# Patient Record
Sex: Female | Born: 1975 | Race: Black or African American | Hispanic: No | Smoking: Never smoker
Health system: Southern US, Community
[De-identification: ages and names within clinical notes are randomized; demographics above are authoritative.]

## PROBLEM LIST (undated history)

## (undated) DIAGNOSIS — I10 Essential (primary) hypertension: Secondary | ICD-10-CM

## (undated) HISTORY — PX: KNEE ARTHROSCOPY: SHX127

## (undated) HISTORY — PX: DILATION AND CURETTAGE OF UTERUS: SHX78

---

## 2013-02-22 DIAGNOSIS — R072 Precordial pain: Secondary | ICD-10-CM | POA: Insufficient documentation

## 2013-02-22 DIAGNOSIS — F32A Depression, unspecified: Secondary | ICD-10-CM | POA: Insufficient documentation

## 2013-02-22 DIAGNOSIS — R42 Dizziness and giddiness: Secondary | ICD-10-CM | POA: Insufficient documentation

## 2013-02-22 DIAGNOSIS — K219 Gastro-esophageal reflux disease without esophagitis: Secondary | ICD-10-CM | POA: Insufficient documentation

## 2013-02-22 DIAGNOSIS — F410 Panic disorder [episodic paroxysmal anxiety] without agoraphobia: Secondary | ICD-10-CM | POA: Insufficient documentation

## 2013-02-22 DIAGNOSIS — M542 Cervicalgia: Secondary | ICD-10-CM | POA: Insufficient documentation

## 2013-02-22 DIAGNOSIS — E049 Nontoxic goiter, unspecified: Secondary | ICD-10-CM | POA: Insufficient documentation

## 2013-02-22 DIAGNOSIS — R079 Chest pain, unspecified: Secondary | ICD-10-CM | POA: Insufficient documentation

## 2013-02-22 DIAGNOSIS — F419 Anxiety disorder, unspecified: Secondary | ICD-10-CM | POA: Insufficient documentation

## 2013-02-22 DIAGNOSIS — J209 Acute bronchitis, unspecified: Secondary | ICD-10-CM | POA: Insufficient documentation

## 2013-02-22 DIAGNOSIS — H9190 Unspecified hearing loss, unspecified ear: Secondary | ICD-10-CM | POA: Insufficient documentation

## 2013-02-22 DIAGNOSIS — E559 Vitamin D deficiency, unspecified: Secondary | ICD-10-CM | POA: Insufficient documentation

## 2013-02-22 DIAGNOSIS — R002 Palpitations: Secondary | ICD-10-CM | POA: Insufficient documentation

## 2021-05-31 LAB — OB RESULTS CONSOLE RPR: RPR: NONREACTIVE

## 2021-05-31 LAB — OB RESULTS CONSOLE RUBELLA ANTIBODY, IGM: Rubella: IMMUNE

## 2021-05-31 LAB — OB RESULTS CONSOLE HIV ANTIBODY (ROUTINE TESTING): HIV: NONREACTIVE

## 2021-05-31 LAB — OB RESULTS CONSOLE HEPATITIS B SURFACE ANTIGEN: Hepatitis B Surface Ag: NEGATIVE

## 2021-06-03 ENCOUNTER — Inpatient Hospital Stay (HOSPITAL_COMMUNITY): Admission: AD | Admit: 2021-06-03 | Payer: BC Managed Care – PPO | Source: Home / Self Care | Admitting: Obstetrics

## 2021-08-28 ENCOUNTER — Other Ambulatory Visit: Payer: Self-pay

## 2021-08-29 ENCOUNTER — Inpatient Hospital Stay (HOSPITAL_BASED_OUTPATIENT_CLINIC_OR_DEPARTMENT_OTHER): Payer: BC Managed Care – PPO

## 2021-08-29 ENCOUNTER — Inpatient Hospital Stay (HOSPITAL_COMMUNITY)
Admission: AD | Admit: 2021-08-29 | Discharge: 2021-09-23 | DRG: 787 | Disposition: A | Discharge status: Home / Self Care | Payer: BC Managed Care – PPO | Attending: Obstetrics & Gynecology | Admitting: Obstetrics & Gynecology

## 2021-08-29 ENCOUNTER — Encounter (HOSPITAL_COMMUNITY): Payer: Self-pay | Admitting: Obstetrics and Gynecology

## 2021-08-29 ENCOUNTER — Other Ambulatory Visit: Payer: Self-pay

## 2021-08-29 DIAGNOSIS — O365120 Maternal care for known or suspected placental insufficiency, second trimester, not applicable or unspecified: Secondary | ICD-10-CM | POA: Diagnosis present

## 2021-08-29 DIAGNOSIS — O09522 Supervision of elderly multigravida, second trimester: Secondary | ICD-10-CM

## 2021-08-29 DIAGNOSIS — O10912 Unspecified pre-existing hypertension complicating pregnancy, second trimester: Secondary | ICD-10-CM | POA: Diagnosis not present

## 2021-08-29 DIAGNOSIS — O365931 Maternal care for other known or suspected poor fetal growth, third trimester, fetus 1: Secondary | ICD-10-CM

## 2021-08-29 DIAGNOSIS — O10012 Pre-existing essential hypertension complicating pregnancy, second trimester: Secondary | ICD-10-CM | POA: Diagnosis not present

## 2021-08-29 DIAGNOSIS — Z3A24 24 weeks gestation of pregnancy: Secondary | ICD-10-CM | POA: Diagnosis not present

## 2021-08-29 DIAGNOSIS — Z3A23 23 weeks gestation of pregnancy: Secondary | ICD-10-CM | POA: Diagnosis not present

## 2021-08-29 DIAGNOSIS — O114 Pre-existing hypertension with pre-eclampsia, complicating childbirth: Principal | ICD-10-CM | POA: Diagnosis present

## 2021-08-29 DIAGNOSIS — G4700 Insomnia, unspecified: Secondary | ICD-10-CM | POA: Diagnosis present

## 2021-08-29 DIAGNOSIS — Z20822 Contact with and (suspected) exposure to covid-19: Secondary | ICD-10-CM | POA: Diagnosis present

## 2021-08-29 DIAGNOSIS — O99354 Diseases of the nervous system complicating childbirth: Secondary | ICD-10-CM | POA: Diagnosis present

## 2021-08-29 DIAGNOSIS — O358XX0 Maternal care for other (suspected) fetal abnormality and damage, not applicable or unspecified: Secondary | ICD-10-CM | POA: Diagnosis present

## 2021-08-29 DIAGNOSIS — O365930 Maternal care for other known or suspected poor fetal growth, third trimester, not applicable or unspecified: Secondary | ICD-10-CM | POA: Diagnosis not present

## 2021-08-29 DIAGNOSIS — O9832 Other infections with a predominantly sexual mode of transmission complicating childbirth: Secondary | ICD-10-CM | POA: Diagnosis present

## 2021-08-29 DIAGNOSIS — D563 Thalassemia minor: Secondary | ICD-10-CM | POA: Diagnosis present

## 2021-08-29 DIAGNOSIS — Z23 Encounter for immunization: Secondary | ICD-10-CM

## 2021-08-29 DIAGNOSIS — O321XX0 Maternal care for breech presentation, not applicable or unspecified: Secondary | ICD-10-CM | POA: Diagnosis present

## 2021-08-29 DIAGNOSIS — O365910 Maternal care for other known or suspected poor fetal growth, first trimester, not applicable or unspecified: Secondary | ICD-10-CM | POA: Diagnosis not present

## 2021-08-29 DIAGNOSIS — O99212 Obesity complicating pregnancy, second trimester: Secondary | ICD-10-CM | POA: Diagnosis not present

## 2021-08-29 DIAGNOSIS — O10919 Unspecified pre-existing hypertension complicating pregnancy, unspecified trimester: Secondary | ICD-10-CM

## 2021-08-29 DIAGNOSIS — A6000 Herpesviral infection of urogenital system, unspecified: Secondary | ICD-10-CM | POA: Diagnosis present

## 2021-08-29 DIAGNOSIS — O1492 Unspecified pre-eclampsia, second trimester: Secondary | ICD-10-CM | POA: Diagnosis not present

## 2021-08-29 DIAGNOSIS — O3412 Maternal care for benign tumor of corpus uteri, second trimester: Secondary | ICD-10-CM | POA: Diagnosis present

## 2021-08-29 DIAGNOSIS — O99214 Obesity complicating childbirth: Secondary | ICD-10-CM | POA: Diagnosis present

## 2021-08-29 DIAGNOSIS — O365990 Maternal care for other known or suspected poor fetal growth, unspecified trimester, not applicable or unspecified: Secondary | ICD-10-CM

## 2021-08-29 DIAGNOSIS — E669 Obesity, unspecified: Secondary | ICD-10-CM

## 2021-08-29 DIAGNOSIS — O365920 Maternal care for other known or suspected poor fetal growth, second trimester, not applicable or unspecified: Secondary | ICD-10-CM

## 2021-08-29 DIAGNOSIS — O1002 Pre-existing essential hypertension complicating childbirth: Secondary | ICD-10-CM | POA: Diagnosis present

## 2021-08-29 DIAGNOSIS — O1012 Pre-existing hypertensive heart disease complicating childbirth: Secondary | ICD-10-CM | POA: Diagnosis not present

## 2021-08-29 DIAGNOSIS — Z3A25 25 weeks gestation of pregnancy: Secondary | ICD-10-CM | POA: Diagnosis not present

## 2021-08-29 DIAGNOSIS — O1410 Severe pre-eclampsia, unspecified trimester: Secondary | ICD-10-CM | POA: Diagnosis not present

## 2021-08-29 DIAGNOSIS — I10 Essential (primary) hypertension: Secondary | ICD-10-CM | POA: Diagnosis present

## 2021-08-29 DIAGNOSIS — O365925 Maternal care for other known or suspected poor fetal growth, second trimester, fetus 5: Secondary | ICD-10-CM

## 2021-08-29 DIAGNOSIS — D259 Leiomyoma of uterus, unspecified: Secondary | ICD-10-CM | POA: Diagnosis present

## 2021-08-29 DIAGNOSIS — O1415 Severe pre-eclampsia, complicating the puerperium: Secondary | ICD-10-CM | POA: Diagnosis present

## 2021-08-29 DIAGNOSIS — Z98891 History of uterine scar from previous surgery: Secondary | ICD-10-CM

## 2021-08-29 DIAGNOSIS — Z3689 Encounter for other specified antenatal screening: Secondary | ICD-10-CM

## 2021-08-29 DIAGNOSIS — E668 Other obesity: Secondary | ICD-10-CM | POA: Diagnosis not present

## 2021-08-29 DIAGNOSIS — Z3A26 26 weeks gestation of pregnancy: Secondary | ICD-10-CM | POA: Diagnosis not present

## 2021-08-29 DIAGNOSIS — O09812 Supervision of pregnancy resulting from assisted reproductive technology, second trimester: Secondary | ICD-10-CM | POA: Diagnosis not present

## 2021-08-29 HISTORY — DX: Essential (primary) hypertension: I10

## 2021-08-29 LAB — COMPREHENSIVE METABOLIC PANEL
ALT: 36 U/L (ref 0–44)
AST: 35 U/L (ref 15–41)
Albumin: 3.1 g/dL — ABNORMAL LOW (ref 3.5–5.0)
Alkaline Phosphatase: 53 U/L (ref 38–126)
Anion gap: 9 (ref 5–15)
BUN: 8 mg/dL (ref 6–20)
CO2: 23 mmol/L (ref 22–32)
Calcium: 9.4 mg/dL (ref 8.9–10.3)
Chloride: 105 mmol/L (ref 98–111)
Creatinine, Ser: 0.82 mg/dL (ref 0.44–1.00)
GFR, Estimated: >60 mL/min (ref >60)
Glucose, Bld: 95 mg/dL (ref 70–99)
Potassium: 3.9 mmol/L (ref 3.5–5.1)
Sodium: 137 mmol/L (ref 135–145)
Total Bilirubin: 0.2 mg/dL — ABNORMAL LOW (ref 0.3–1.2)
Total Protein: 5.9 g/dL — ABNORMAL LOW (ref 6.5–8.1)

## 2021-08-29 LAB — RESP PANEL BY RT-PCR (FLU A&B, COVID) ARPGX2
Influenza A by PCR: NEGATIVE
Influenza B by PCR: NEGATIVE
SARS Coronavirus 2 by RT PCR: NEGATIVE

## 2021-08-29 LAB — CBC
HCT: 37.7 % (ref 36.0–46.0)
Hemoglobin: 12 g/dL (ref 12.0–15.0)
MCH: 27.4 pg (ref 26.0–34.0)
MCHC: 31.8 g/dL (ref 30.0–36.0)
MCV: 86.1 fL (ref 80.0–100.0)
Platelets: 203 10*3/uL (ref 150–400)
RBC: 4.38 MIL/uL (ref 3.87–5.11)
RDW: 14.3 % (ref 11.5–15.5)
WBC: 7.4 10*3/uL (ref 4.0–10.5)
nRBC: 0 % (ref 0.0–0.2)

## 2021-08-29 LAB — TYPE AND SCREEN
ABO/RH(D): O POS
Antibody Screen: NEGATIVE

## 2021-08-29 LAB — PROTEIN / CREATININE RATIO, URINE
Creatinine, Urine: 259.49 mg/dL
Protein Creatinine Ratio: 0.13 mg/mg{Cre} (ref 0.00–0.15)
Total Protein, Urine: 33 mg/dL

## 2021-08-29 MED ORDER — LABETALOL HCL 200 MG PO TABS: 400.0000 mg | ORAL_TABLET | Freq: Three times a day (TID) | ORAL | Status: DC

## 2021-08-29 MED ORDER — ACYCLOVIR 400 MG PO TABS
400.0000 mg | ORAL_TABLET | Freq: Three times a day (TID) | ORAL | Status: DC
  Administered 2021-08-29 – 2021-09-02 (×9): 400 mg via ORAL
  Filled 2021-08-29 (×14): qty 1

## 2021-08-29 MED ORDER — ASPIRIN 81 MG PO CHEW
81.0000 mg | CHEWABLE_TABLET | Freq: Every day | ORAL | Status: DC
  Administered 2021-08-29 – 2021-09-23 (×26): 81 mg via ORAL
  Filled 2021-08-29 (×26): qty 1

## 2021-08-29 MED ORDER — NIFEDIPINE ER OSMOTIC RELEASE 30 MG PO TB24
30.0000 mg | ORAL_TABLET | Freq: Every day | ORAL | Status: DC
  Administered 2021-08-30 – 2021-09-16 (×18): 30 mg via ORAL
  Filled 2021-08-29 (×19): qty 1

## 2021-08-29 MED ORDER — BETAMETHASONE SOD PHOS & ACET 6 (3-3) MG/ML IJ SUSP
12.0000 mg | INTRAMUSCULAR | Status: AC
  Administered 2021-08-29 – 2021-08-30 (×2): 12 mg via INTRAMUSCULAR
  Filled 2021-08-29: qty 5

## 2021-08-29 MED ORDER — LABETALOL HCL 200 MG PO TABS
400.0000 mg | ORAL_TABLET | Freq: Three times a day (TID) | ORAL | Status: DC
  Administered 2021-08-29 – 2021-08-30 (×3): 400 mg via ORAL
  Filled 2021-08-29 (×3): qty 2

## 2021-08-29 MED ORDER — ACETAMINOPHEN 325 MG PO TABS
650.0000 mg | ORAL_TABLET | ORAL | Status: DC | PRN
  Filled 2021-08-29 (×2): qty 2

## 2021-08-29 MED ORDER — HYDROXYZINE HCL 50 MG PO TABS
25.0000 mg | ORAL_TABLET | Freq: Every day | ORAL | Status: DC
  Administered 2021-08-29 – 2021-09-22 (×25): 25 mg via ORAL
  Filled 2021-08-29 (×25): qty 1

## 2021-08-29 MED ORDER — CALCIUM CARBONATE ANTACID 500 MG PO CHEW: 2.0000 | CHEWABLE_TABLET | ORAL | Status: DC | PRN

## 2021-08-29 MED ORDER — NIFEDIPINE ER OSMOTIC RELEASE 30 MG PO TB24: 30.0000 mg | ORAL_TABLET | Freq: Two times a day (BID) | ORAL | Status: DC

## 2021-08-29 MED ORDER — NIFEDIPINE ER OSMOTIC RELEASE 30 MG PO TB24: 30.0000 mg | ORAL_TABLET | Freq: Every day | ORAL | Status: DC

## 2021-08-29 MED ORDER — ZOLPIDEM TARTRATE 5 MG PO TABS: 5.0000 mg | ORAL_TABLET | Freq: Every evening | ORAL | Status: DC | PRN

## 2021-08-29 MED ORDER — PRENATAL MULTIVITAMIN CH
1.0000 | ORAL_TABLET | Freq: Every day | ORAL | Status: DC
  Administered 2021-08-29 – 2021-09-19 (×22): 1 via ORAL
  Filled 2021-08-29 (×22): qty 1

## 2021-08-29 MED ORDER — DOCUSATE SODIUM 100 MG PO CAPS
100.0000 mg | ORAL_CAPSULE | Freq: Every day | ORAL | Status: DC
  Administered 2021-08-30 – 2021-09-19 (×11): 100 mg via ORAL
  Filled 2021-08-29 (×20): qty 1

## 2021-08-29 MED ORDER — NIFEDIPINE ER OSMOTIC RELEASE 60 MG PO TB24
60.0000 mg | ORAL_TABLET | Freq: Every day | ORAL | Status: DC
  Administered 2021-08-29 – 2021-09-15 (×18): 60 mg via ORAL
  Filled 2021-08-29 (×18): qty 1

## 2021-08-29 MED ORDER — LACTATED RINGERS IV BOLUS
1000.0000 mL | Freq: Once | INTRAVENOUS | Status: AC
  Administered 2021-08-29: 1000 mL via INTRAVENOUS

## 2021-08-29 NOTE — MAU Note (Signed)
Presents for BP evaluation, states sent from office.  Endorses current H/A, denies visual disturbances and epigastric pain.  Denies VB or LOF.  Endorses +FM.

## 2021-08-29 NOTE — Progress Notes (Signed)
Spoke w/ MFM Dr Gertie Exon and reviewed consult note, appreciate assistance w/ patient care  Plan to increase Procardia to 60mg  XL tonight and reassess if 60mg  in AM needed or continue 30mg  XL in AM Repeat Labs 1/14  CEFM 2 hours every 6 hours as lnong as no decels and needs to be CEFM if HTN crisis 160/110 or worse

## 2021-08-29 NOTE — H&P (Signed)
Brianna Lucero is a 46 y.o. female presenting for BP control and fetal monitoring due to recent worsening BPs with some in severe range and new onset severe IUGR at 23.5 wks and elevated UAD. Pt was seen in the office on 08/28/21 and was advised to come for admission for observation, titrate BP meds and MFM consult and repeat UAD   46 yo G3P0020. AMA, CHTN, Fibroids, HSV II, Obesity, IVF preg -donor egg (PGS not tested). 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 Pt received flu vaccine at 1st ob visit.   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  Obesity and famHx of DM II- A1C 6.0 in 1st trim.  VTE and swelling -. Compression stockings Insomnia- Hydroxyzine prn   Anatomy sono EFW  overall 49%, AC 44% BPD 48% FL 49% female, Vx, placenta anterior  Sono on 08/28/21 23.5 wks - 1'1" at 2% AC 3% BPD 1% FL 9% Breech, 22.2 wks by measurements, UAD elevated 6.52, AFI  pocket 4.9 cm, Breech.  BPP 8/8.Marland Kitchen Funic presentation. Right renal pyelectasis 4 mm.       24 hr urine protein 135 mg on 06/28/21 Urine P/C 0.15 on 08/13/21 CMP wnl on 08/13/21  OB History     Gravida  1   Para      Term      Preterm      AB      Living  0      SAB      IAB      Ectopic      Multiple      Live Births  0          Past Medical History:  Diagnosis Date   Hypertension    Past Surgical History:  Procedure Laterality Date    DILATION AND CURETTAGE OF UTERUS     KNEE ARTHROSCOPY     Family History: family history is not on file. Social History:  reports that she has never smoked. She has never used smokeless tobacco. She reports that she does not drink alcohol and does not use drugs.     Maternal Diabetes: No  A1C 6.0 in 1st trim  Genetic Screening: Normal  NIPT and AFP1  Maternal Ultrasounds/Referrals: IUGR and Other: anatomy sono at 18.5 wks with normal growth at 43 % and now at 23.5 wks dropped to 2% Fetal Ultrasounds or other Referrals:  Referred to [facility] F[facility]  saw MFM Dr Wynelle Link before pregnancy  Maternal Substance Abuse:  No Significant Maternal Medications:  Meds include: Other: Procardia 30 mg XL bid, Labetalol 400mg  TID, Hydroxyzine prn Significant Maternal Lab Results:  Other: carrier screen +  + Alpha thal minor (FOB also, 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 rate 20, height 5\' 6"  (1.676 m), weight 106.5 kg, SpO2 100 %. Exam Physical Exam  Patient Vitals for the past 24 hrs:  BP Temp Temp src Pulse Resp SpO2 Height Weight  08/29/21 1528 (!) 174/95 98.7 F (37.1 C) Oral 80 19 -- -- --  08/29/21 1130 (!) 155/86 -- -- 70 -- -- -- --  08/29/21 1115 (!) 148/87 -- -- 77 -- 100 % -- --  08/29/21 1100 (!) 142/82 -- -- 76 -- 100 % -- --  08/29/21 1054 137/83 -- -- 70 -- -- -- --  08/29/21 1038 138/86 -- -- 72 20 100 % -- --  08/29/21 1002 (!) 149/89 98.1 F (36.7 C) Oral 77 20 99 % -- --  08/29/21 0956 -- -- -- -- -- -- 5\' 6"  (1.676 m) 106.5 kg    Physical exam:  A&O x 3, no acute distress. Pleasant HEENT neg, no thyromegaly Lungs CTA bilat CV RRR, S1S2 normal Abdo soft, non tender, non acute gravid, BREECH  Extr no  edema/ tenderness. Slight edema, DTR +2 Pelvic deferred FHT  140s minimal variability + accels, no decels overall reactive Toco irritability noted   Prenatal labs: ABO, Rh: --/--/O POS (01/12 1238) Antibody: NEG (01/12 1238) Rubella:  Immu VZ immune RPR:   neg HBsAg:   neg HIV:   neg GBS:   N/A    Assessment/Plan: 46 yo G3P0020, here for worsening chronic hypertension, new onset IUGR at 23.5 wks.  Admit for observation IUGR - CEFM to assess fetal status and repeat UAD and MFM consult for discharge planning. BTMZ for prematurity due to abn UAD CHTN- PIH Labs  wnl. BPs non severe range, Labetalol 400 mg q8 hrs, Procardia 30mg  XL q 12 hrs , bbASA. BP better, likely all increased doses now working and also stopped working since yesterday, contin modified bedrest Insomnia- Hydroxyzine at night   Obesity - SCDs, will assess to start Lovenox if long term stay Fibroids- assess positions for delivery planning Breech - C/s likely unless turns  HSV- no outbreaks, can start suppression  + Carrier states- but donor (egg) is clear of all carrier states  MFM consult pending    Elveria Royals 08/29/2021, 2:50 PM

## 2021-08-29 NOTE — Consult Note (Addendum)
MFM Consultation (UPDATED WITH [place]).  Brianna Lucero is a 46 yo AA at 31 w 6d with an EDD of 12/20/21 with good dates. She is seen today at the request of Dr. Azucena Fallen regarding [facility] and severe FGR.  Brianna Lucero is overall doing well she denies s/sx of PTL or preeclamspsia. She had a headached the quickly resolved with improved blood pressure.  She has a history of CHTN which has been difficult to control over the last 3 weeks. She was initially started on Labetalol 100 TID and has now titrated to 400 TID with Procardia 30 mg XL BID.  Her CBC, CMP and UPC are normal.  In addition, yesterday she was diagnosis with FGR with abnormal UA Dopplers (uncertain quality elevated vs absent vs reversed??) and an EFW 2%.  She received her first [place] today.  Vitals:   08/29/21 1130 08/29/21 1528 08/29/21 1545 08/29/21 1620  BP: (!) 155/86 (!) 174/95 (!) 170/93 (!) 159/89  Pulse: 70 80 83   Temp:  98.7 F (37.1 C)    Resp:  19    Height:      Weight:      SpO2:      TempSrc:  Oral    BMI (Calculated):       CBC Latest Ref Rng & Units 08/29/2021  WBC 4.0 - 10.5 K/uL 7.4  Hemoglobin 12.0 - 15.0 g/dL 12.0  Hematocrit 36.0 - 46.0 % 37.7  Platelets 150 - 400 K/uL 203   CMP Latest Ref Rng & Units 08/29/2021  Glucose 70 - 99 mg/dL 95  BUN 6 - 20 mg/dL 8  Creatinine 0.44 - 1.00 mg/dL 0.82  Sodium 135 - 145 mmol/L 137  Potassium 3.5 - 5.1 mmol/L 3.9  Chloride 98 - 111 mmol/L 105  CO2 22 - 32 mmol/L 23  Calcium 8.9 - 10.3 mg/dL 9.4  Total Protein 6.5 - 8.1 g/dL 5.9(L)  Total Bilirubin 0.3 - 1.2 mg/dL 0.2(L)  Alkaline Phos 38 - 126 U/L 53  AST 15 - 41 U/L 35  ALT 0 - 44 U/L 36   OB History  Gravida Para Term Preterm AB Living  1 0 0 0 0 0  SAB IAB Ectopic Multiple Live Births  0 0 0 0 0    # Outcome Date GA Lbr Len/2nd Weight Sex Delivery Anes PTL Lv  1 Current            Past Medical History:  Diagnosis Date   Hypertension    Past Surgical History:  Procedure Laterality Date   DILATION  AND CURETTAGE OF UTERUS     KNEE ARTHROSCOPY     Social History   Socioeconomic History   Marital status: Divorced    Spouse name: Not on file   Number of children: Not on file   Years of education: Not on file   Highest education level: Not on file  Occupational History   Not on file  Tobacco Use   Smoking status: Never   Smokeless tobacco: Never  Substance and Sexual Activity   Alcohol use: Never   Drug use: Never   Sexual activity: Yes  Other Topics Concern   Not on file  Social History Narrative   Not on file   Social Determinants of Health   Financial Resource Strain: Not on file  Food Insecurity: Not on file  Transportation Needs: Not on file  Physical Activity: Not on file  Stress: Not on file  Social Connections: Not on file  Intimate Partner Violence: Not on file   History reviewed. No pertinent family history.  Current Facility-Administered Medications (Endocrine & Metabolic):    betamethasone acetate-betamethasone sodium phosphate (CELESTONE) injection 12 mg   Current Facility-Administered Medications (Cardiovascular):    labetalol (NORMODYNE) tablet 400 mg   NIFEdipine (PROCARDIA-XL/NIFEDICAL-XL) 24 hr tablet 30 mg     Current Facility-Administered Medications (Analgesics):    acetaminophen (TYLENOL) tablet 650 mg   aspirin chewable tablet 81 mg     Current Facility-Administered Medications (Other):    acyclovir (ZOVIRAX) tablet 400 mg   calcium carbonate (TUMS - dosed in mg elemental calcium) chewable tablet 400 mg of elemental calcium   docusate sodium (COLACE) capsule 100 mg   hydrOXYzine (ATARAX) tablet 25 mg   prenatal multivitamin tablet 1 tablet   zolpidem (AMBIEN) tablet 5 mg  No current outpatient medications on file. History reviewed. No pertinent family history. No Known Allergies   Imaging: Pending bedside exam  Impression/Counseling:  I reviewed the above history with Brianna Lucero. I discussed the diagnosis, evaluation  and management of severe hypertension and severe fetal growth restriction.  I discussed the diagnosis of severe hypertension includes blood pressure >160/110 mmHg. We recommend maintaining blood pressure below 150/100 mmHg with a goal of 135/85 mmHg. Management includes antihypertension agents including oral and IV. In cases, like Ms. Scardina's we recommend cycling IV therapy if blood pressure persist over a 15 minute period above the previously stated threshold. We adhere to the ACOG protocol employing Labetalol, oral IR procardia and hydralazine. If blood pressure is not controlled we recommend delivery.  It is reasonable given this early gestational age to maximize two agents.  I discussed with Brianna Lucero the risk of severe hypertension includes maternal stroke, placental abruption, stillbirth, eclampsia and fetal growth restriction.  Regarding fetal growth restriction. I discussed the diagnosis, evaluation and management of fetal growth restriction.  A growth ultrasound is ordered today to rule out other causes, but I suspect this is placental insufficiency. In addition UA Dopplers will be performed. IF abnormal we recommend 2x weekly UA Dopplers. Daily NST are recommend with the goal of detecting good variability and the absence of fetal decelerations. Given this early gestational age we appreciate the difficulty of a consistent tracing that occurs with more advanced gestational ages.   A NICU consult has been requested and we are aware that the NICU is closed and will require transfer to [facility] is delivery is likely in the next few days.  Lastly indications for delivery include: Abnormal labs Non-reassuring fetal testing Persistent CNS maternal symptoms. Uncontrolled BP.  Recommendations: -Repeat labs every 48-72  hours while symptoms persist -Maintain BP 135/85 avg -Repeat growth in 3 weeks -Daily NST and 2x weekly UA Dopplers -Complete course of BMZ. -Transfer to [facility]  if blood pressure are not controlled within 48 hours or imminent delivery is suspected.  Consider discharge to outpatient if blood pressure are stable for 36-48 hours.  All questions answered   I spent 60 minutes with > 50% in face to face consultation.  I discussed this plan of care with Dr. Benjie Karvonen.  Vikki Ports, MD  ADDENDUM US PERFORMED ON 1/12 Single intrauterine pregnancy here for a detailed anatomy due to new diagnosis of fetal growth restriction with severe hypertension. Normal anatomy with measurements consistent with fetal growth restriction of 4.5% There is good fetal movement and amniotic fluid volume Suboptimal views of the fetal anatomy were obtained secondary to fetal position.  The UA Dopplers  demonstrate normal flow without evidence of AEDF or REDF.  The outside exam suggested elevated UA Dopplers therefore weekly UA Dopplers can be performed instead of twice weekly UA Dopplers.

## 2021-08-29 NOTE — Progress Notes (Signed)
D/w pt NICU beds not open, lives near [place] and we can transfer to MFM/ Antenatal service there if this becomes a long term admission

## 2021-08-29 NOTE — MAU Note (Signed)
.  Brianna Lucero is a 46 y.o. at [redacted]w[redacted]d here in MAU reporting: Direct admit for HTN management. Patient has a history of CHTN, IUGR, IVF, A1c 6.  Awaiting POC from Dr. Benjie Karvonen.   Vitals:   08/29/21 1002 08/29/21 1038  BP: (!) 149/89 138/86  Pulse: 77 72  Resp: 20 20  Temp: 98.1 F (36.7 C)   SpO2: 99% 100%     FHT 140  Lab orders placed from triage:  UA

## 2021-08-29 NOTE — Progress Notes (Signed)
Dr. Benjie Karvonen made aware of her severe range BP x 2, per MD, repeat BP in 30 minutes and if still in severe range, call her back to increase po BP medication orders.

## 2021-08-29 NOTE — MAU Provider Note (Signed)
History     [id number]  Arrival date and time: 08/29/21 0934    No chief complaint on file.    HPI Brianna Lucero is a 46 y.o. at [redacted]w[redacted]d by IVF insemination with PMHx notable for cHTN on meds, who presents for elevated BP.   Review of outside prenatal records from [facility] (in media tab): hx of recurrent losses, AMA, IVF pregnancy, cHTN, HSV, fibroids. Current meds ASA, acyclovir, labetalol, procardia. Review of BP's show they have been mostly mild range in 130-140s/80-90's except for visit yesterday when she had a BP of 160/88. Then had growth scan which showed IUGR 2% and abnormal dopplers. Admission recommended, plan to come to MAU.  Patient feeling well at present Endorses a headache earlier today that is going away No vision changes, chest pain Has had SOB for months, but no recent change No RUQ pain Endorses significant swelling of her lower extremities No vaginal bleeding or loss of fluid Endorses fetal movement No contractions     OB History     Gravida  1   Para      Term      Preterm      AB      Living  0      SAB      IAB      Ectopic      Multiple      Live Births  0           Past Medical History:  Diagnosis Date   Hypertension     Past Surgical History:  Procedure Laterality Date   DILATION AND CURETTAGE OF UTERUS     KNEE ARTHROSCOPY      History reviewed. No pertinent family history.  Social History   Socioeconomic History   Marital status: Divorced    Spouse name: Not on file   Number of children: Not on file   Years of education: Not on file   Highest education level: Not on file  Occupational History   Not on file  Tobacco Use   Smoking status: Never   Smokeless tobacco: Never  Substance and Sexual Activity   Alcohol use: Never   Drug use: Never   Sexual activity: Yes  Other Topics Concern   Not on file  Social History Narrative   Not on file   Social Determinants of Health   Financial  Resource Strain: Not on file  Food Insecurity: Not on file  Transportation Needs: Not on file  Physical Activity: Not on file  Stress: Not on file  Social Connections: Not on file  Intimate Partner Violence: Not on file    Not on File  No current facility-administered medications on file prior to encounter.   Current Outpatient Medications on File Prior to Encounter  Medication Sig Dispense Refill   cholecalciferol (VITAMIN D3) 25 MCG (1000 UNIT) tablet Take 1,000 Units by mouth daily.     labetalol (NORMODYNE) 200 MG tablet Take 400 mg by mouth 3 (three) times daily.     loratadine (CLARITIN) 10 MG tablet Take 10 mg by mouth daily.     NIFEdipine (PROCARDIA-XL/NIFEDICAL-XL) 30 MG 24 hr tablet Take 30 mg by mouth 2 (two) times daily.     Prenatal Vit-Fe Fumarate-FA (PRENATAL MULTIVITAMIN) TABS tablet Take 1 tablet by mouth daily at 12 noon.       ROS Pertinent positives and negative per HPI, all others reviewed and negative  Physical Exam   BP (!) 142/82  Pulse 76    Temp 98.1 F (36.7 C) (Oral)    Resp 20    Ht 5\' 6"  (1.676 m)    Wt 106.5 kg    SpO2 100%    BMI 37.90 kg/m   Patient Vitals for the past 24 hrs:  BP Temp Temp src Pulse Resp SpO2 Height Weight  08/29/21 1100 (!) 142/82 -- -- 76 -- 100 % -- --  08/29/21 1054 137/83 -- -- 70 -- -- -- --  08/29/21 1038 138/86 -- -- 72 20 100 % -- --  08/29/21 1002 (!) 149/89 98.1 F (36.7 C) Oral 77 20 99 % -- --  08/29/21 0956 -- -- -- -- -- -- 5\' 6"  (1.676 m) 106.5 kg    Physical Exam Vitals reviewed.  Constitutional:      General: She is not in acute distress.    Appearance: She is well-developed. She is not diaphoretic.  Eyes:     General: No scleral icterus. Pulmonary:     Effort: Pulmonary effort is normal. No respiratory distress.  Skin:    General: Skin is warm and dry.  Neurological:     Mental Status: She is alert.     Coordination: Coordination normal.     Cervical Exam    Bedside Ultrasound Not  done  My interpretation: n/a  FHT Unable to trace baby  Labs Results for orders placed or performed during the hospital encounter of 08/29/21 (from the past 24 hour(s))  CBC     Status: None   Collection Time: 08/29/21 10:24 AM  Result Value Ref Range   WBC 7.4 4.0 - 10.5 K/uL   RBC 4.38 3.87 - 5.11 MIL/uL   Hemoglobin 12.0 12.0 - 15.0 g/dL   HCT 37.7 36.0 - 46.0 %   MCV 86.1 80.0 - 100.0 fL   MCH 27.4 26.0 - 34.0 pg   MCHC 31.8 30.0 - 36.0 g/dL   RDW 14.3 11.5 - 15.5 %   Platelets 203 150 - 400 K/uL   nRBC 0.0 0.0 - 0.2 %    Imaging No results found.  MAU Course  Procedures Lab Orders         CBC         Comprehensive metabolic panel         Protein / creatinine ratio, urine    No orders of the defined types were placed in this encounter.  Imaging Orders  No imaging studies ordered today    MDM High  Assessment and Plan  #Likely cHTN with superimposed pre-eclampsia with severe features #IUGR fetus Patient records reviewed extensively. Overall picture very concerning for superimposed Pre-E given escalating BP med requirements, IUGR, and abnormal umbilical doppler arteries. Per RN discussion with Dr. Benjie Karvonen she will be admitted to [facility], admission approved by NICU (currently on red status). Basic Pre-E labs pending.     Dispo: admit to [facility] per Dr. Benjie Karvonen who will assume care.    Clarnce Flock, MD/MPH 08/29/21 11:10 AM

## 2021-08-30 MED ORDER — LABETALOL HCL 200 MG PO TABS
600.0000 mg | ORAL_TABLET | Freq: Three times a day (TID) | ORAL | Status: DC
  Administered 2021-08-30 – 2021-09-21 (×66): 600 mg via ORAL
  Filled 2021-08-30 (×66): qty 3

## 2021-08-30 NOTE — Progress Notes (Signed)
Patient ID: Chasiti Waddington, female   DOB: 1975/09/26, 46 y.o.   MRN: [id number] HD #2 24 wks  Severe IUGR CHTN with worsening BPs   S: Feels well, no HA/ SOB/ CP. Got some rest off monitors.   O: BP 137/81 (BP Location: Left Arm)    Pulse 81    Temp 99.3 F (37.4 C) (Oral)    Resp 18    Ht 5\' 6"  (1.676 m)    Wt 106.7 kg    SpO2 97%    BMI 37.97 kg/m   Temp:  [97.9 F (36.6 C)-99.3 F (37.4 C)] 99.3 F (37.4 C) (01/13 1602) Pulse Rate:  [80-90] 81 (01/13 1602) Resp:  [18-20] 18 (01/13 1602) BP: (137-164)/(69-88) 137/81 (01/13 1602) SpO2:  [93 %-100 %] 97 % (01/13 1602) Weight:  [106.7 kg] 106.7 kg (01/13 0801)   NAD Lungs CTA CV RRR Gravid soft fibroid uterus  No LE swelling. HOman's neg   FHT 140s mid variability + accels no decels cat I / reactive for 24 wks Toco none  PIH labs reviewed MFM sono 1/12 reviewed  Growth 4.5 %  AC 5% AFI nl, UAD- normal (not elevated here) breech  A/P: 46 yo G3P0020 at 24 wks. Here for BP control and monitor severe IUGR  1) CHTN- increase meds- now Procardia 30mg  XL in AM, 60 mg XL in PM and increased Labetalol to 600 mg TID after consult w/ Dr Gertie Exon this morning  2) IUGR- UAD improved with BP improved 3) Prematurity - S/p BMTZ 1/12, 1/13  4) Breech  5) AMA, IVF (donor egg)  Plan per MFM- Discharge if BPs range <135-140/ 85 and  labs nl. Outpt management with three times/ wk testing and wly UAD and q 3 wk growth    --Azucena Fallen MD

## 2021-08-31 NOTE — Progress Notes (Signed)
Patient ID: Brianna Lucero, female   DOB: 1975/10/28, 46 y.o.   MRN: [id number] HD #3 24'1 wks  Severe IUGR CHTN with worsening BPs though some improvement with increase labetalol and procardia, now on labetalol 600mg  tid and procardia XL 30 qam and 60 qpm.   S: Feels well, no HA/ SOB/ CP. Hopeful for discharge.   O: BP 136/77 (BP Location: Left Arm)    Pulse 88    Temp 99.2 F (37.3 C) (Oral)    Resp 18    Ht 5\' 6"  (1.676 m)    Wt 106.9 kg    SpO2 98%    BMI 38.03 kg/m   Vitals:   08/31/21 0619 08/31/21 0620 08/31/21 0847 08/31/21 1125  BP: (!) 144/80 (!) 144/80 (!) 148/83 136/77  Pulse: 87 87 86 88  Resp:  18 18 18   Temp:   98.7 F (37.1 C) 99.2 F (37.3 C)  TempSrc:   Oral Oral  SpO2:  98% 98% 98%  Weight:      Height:         Temp:  [98.6 F (37 C)-99.3 F (37.4 C)] 99.2 F (37.3 C) (01/14 1125) Pulse Rate:  [81-90] 88 (01/14 1125) Resp:  [18] 18 (01/14 1125) BP: (136-164)/(77-88) 136/77 (01/14 1125) SpO2:  [97 %-98 %] 98 % (01/14 1125) Weight:  [106.9 kg] 106.9 kg (01/14 0410)   NAD Lungs CTA CV RRR Gravid soft fibroid uterus  No RUQ pain No LE swelling.   FHT 140s mid variability + accels no decels cat I / reactive for 24 wks Toco none  PIH labs reviewed MFM sono 1/12 reviewed  Growth 4.5 %  AC 5% AFI nl, UAD- normal (not elevated here) breech  NST 08/30/21 9p: b/l 135s, + 15x15 accles, 10 beat variability, occasional sharp variable decels. Toco: no contractions. AGA  NST 08/31/21, 9a: b/a 140s, + 10x10 accelerations, 10 beat variability,occasional sharp variable decels. Toco: no contractions. AGA  A/P: 46 yo G3P0020 at 24'1 wks. Here for BP control and monitor severe IUGR  1) CHTN- improved control, still elevated bps over past 24 hrs but no maternal sx and no severe range bps. Will cont current medication regimen, caution with dropping placental perfusion pressure in a growth restricted fetus 2) IUGR- UAD improved. Repeat on Monday to reassess for  possible d/c.  3) Prematurity - S/p BMTZ 1/12, 1/13  4) Breech  5) AMA, IVF (donor egg) Dispo- in house for now given occ variable decels and elevated bp, cont inpt monitoring until repeat dopplers on Mon.   Plan per MFM- Discharge if BPs range <135-140/ 85 and  labs nl. Outpt management with three times/ wk testing and wly UAD and q 3 wk growth    Brianna Lucero 08/31/2021 2:04 PM

## 2021-09-01 NOTE — Progress Notes (Addendum)
Patient ID: Brianna Lucero, female   DOB: [birth year], 46 y.o.   MRN: [id number] HD #4 24'2 wks  Severe IUGR CHTN with worsening BPs though  improvement with increase labetalol and procardia, now on labetalol 600mg  tid and procardia XL 30 qam and 60 qpm.   S: Feels well, no HA/ SOB/ CP.  No vision change.  Good fetal movement.  No vaginal bleeding, no leakage of fluid, no contractions.  Patient notes intermittent anxiety and notes that she worries quite a lot.  Patient gives history of anxiety and was on Zoloft until about 3 years ago.  Patient notes currently having more worry being in the hospital and given medical complications.  O: BP (!) 150/83 (BP Location: Left Arm)    Pulse 74    Temp 98.6 F (37 C) (Oral)    Resp 18    Ht 5\' 6"  (1.676 m)    Wt 109.3 kg    SpO2 98%    BMI 38.90 kg/m   Vitals:   08/31/21 2031 08/31/21 2306 09/01/21 0423 09/01/21 0809  BP: (!) 156/80 131/64 (!) 145/64 (!) 150/83  Pulse: 74 88 87 74  Resp:  18 18 18   Temp:  97.9 F (36.6 C) 98.8 F (37.1 C) 98.6 F (37 C)  TempSrc:  Oral Oral Oral  SpO2:  98% 99% 98%  Weight:   109.3 kg   Height:       BP range 1 30-1 50s over 64-90.  Single blood pressure last night 170/90 with a repeat at 156/80  Temp:  [97.9 F (36.6 C)-99.2 F (37.3 C)] 98.6 F (37 C) (01/15 0809) Pulse Rate:  [74-88] 74 (01/15 0809) Resp:  [18] 18 (01/15 0809) BP: (131-170)/(64-90) 150/83 (01/15 0809) SpO2:  [97 %-99 %] 98 % (01/15 0809) Weight:  [109.3 kg] 109.3 kg (01/15 0423)   NAD Lungs CTA CV RRR Gravid soft fibroid uterus  No RUQ pain Trace lower extremity edema   PIH labs reviewed MFM sono 1/12 reviewed  Growth 4.5 %, 514 g/ 1'2;  AC 5% AFI nl, UAD- normal (not elevated here) breech  NST 08/31/21 10p: b/l 140s, + 10x10 accles, 10 beat variability, no decels. Toco: no contractions. AGA  NST 09/01/21, am pending/  A/P: 46 yo G3P0020 at 24'12wks. Here for BP control and monitor IUGR with abnormal Dopplers 1) CHTN-  improved control.  Patient overall with elevated but no severe range blood pressures.  I suspect the rare severe range blood pressure is due to anxiety.  Will cont current medication regimen, caution with dropping placental perfusion pressure in a growth restricted fetus 2) IUGR- UAD improved. Repeat on Monday to reassess for possible d/c.  Fetal testing has remained appropriate for gestational age 96) Prematurity - S/p BMTZ 1/12, 1/13  4) Breech  5) AMA, IVF (donor egg) Dispo- in house for now given occ variable decels and elevated bp, cont inpt monitoring until repeat dopplers on Mon.  -Anxiety.  History of anxiety disorder with panic attack currently no medication.  Options to resume Zoloft discussed with patient.  She will think about this.  Alternative of scheduled versus as needed BuSpar discussed with patient.  We discussed that oftentimes patients with history of anxiety disorders and new complications in pregnancy will need pharmacotherapy.  Patient to let us know if she is ready for treatment  Plan per MFM- Discharge if BPs range <135-140/ 85 and  labs nl. Outpt management with three times/ wk testing and wly UAD and q  3 wk growth    Ala Dach 09/01/2021 10:16 AM    1030 am NST: indication: chronic htn with exacerbation, IUGR. B/l 140s, 8 beat variabilty, occ quick variable decels. AGA. Toco: none.

## 2021-09-02 ENCOUNTER — Inpatient Hospital Stay (HOSPITAL_BASED_OUTPATIENT_CLINIC_OR_DEPARTMENT_OTHER): Payer: BC Managed Care – PPO

## 2021-09-02 DIAGNOSIS — O99212 Obesity complicating pregnancy, second trimester: Secondary | ICD-10-CM

## 2021-09-02 DIAGNOSIS — O365920 Maternal care for other known or suspected poor fetal growth, second trimester, not applicable or unspecified: Secondary | ICD-10-CM

## 2021-09-02 DIAGNOSIS — O10012 Pre-existing essential hypertension complicating pregnancy, second trimester: Secondary | ICD-10-CM | POA: Diagnosis not present

## 2021-09-02 DIAGNOSIS — O09812 Supervision of pregnancy resulting from assisted reproductive technology, second trimester: Secondary | ICD-10-CM

## 2021-09-02 DIAGNOSIS — E669 Obesity, unspecified: Secondary | ICD-10-CM

## 2021-09-02 DIAGNOSIS — Z3A24 24 weeks gestation of pregnancy: Secondary | ICD-10-CM

## 2021-09-02 LAB — CBC
HCT: 36.2 % (ref 36.0–46.0)
Hemoglobin: 12 g/dL (ref 12.0–15.0)
MCH: 28.6 pg (ref 26.0–34.0)
MCHC: 33.1 g/dL (ref 30.0–36.0)
MCV: 86.4 fL (ref 80.0–100.0)
Platelets: 203 10*3/uL (ref 150–400)
RBC: 4.19 MIL/uL (ref 3.87–5.11)
RDW: 14.6 % (ref 11.5–15.5)
WBC: 10 10*3/uL (ref 4.0–10.5)
nRBC: 0 % (ref 0.0–0.2)

## 2021-09-02 LAB — TYPE AND SCREEN
ABO/RH(D): O POS
Antibody Screen: NEGATIVE

## 2021-09-02 LAB — COMPREHENSIVE METABOLIC PANEL
ALT: 26 U/L (ref 0–44)
AST: 23 U/L (ref 15–41)
Albumin: 2.9 g/dL — ABNORMAL LOW (ref 3.5–5.0)
Alkaline Phosphatase: 54 U/L (ref 38–126)
Anion gap: 6 (ref 5–15)
BUN: 10 mg/dL (ref 6–20)
CO2: 24 mmol/L (ref 22–32)
Calcium: 8.9 mg/dL (ref 8.9–10.3)
Chloride: 106 mmol/L (ref 98–111)
Creatinine, Ser: 0.85 mg/dL (ref 0.44–1.00)
GFR, Estimated: >60 mL/min (ref >60)
Glucose, Bld: 92 mg/dL (ref 70–99)
Potassium: 3.7 mmol/L (ref 3.5–5.1)
Sodium: 136 mmol/L (ref 135–145)
Total Bilirubin: 0.5 mg/dL (ref 0.3–1.2)
Total Protein: 5.6 g/dL — ABNORMAL LOW (ref 6.5–8.1)

## 2021-09-02 MED ORDER — ACYCLOVIR 400 MG PO TABS
400.0000 mg | ORAL_TABLET | Freq: Two times a day (BID) | ORAL | Status: DC
  Administered 2021-09-02 – 2021-09-12 (×20): 400 mg via ORAL
  Filled 2021-09-02 (×20): qty 1

## 2021-09-02 NOTE — Progress Notes (Signed)
Brianna Lucero 46 y.o. G1P0 at [redacted]w[redacted]d HD#5 admitted with cHTN exacerbation and IUGR  S: Patient doing well this morning. She admits to mild HA after taking PM Procardia dosing but denies any this morning or any associated vision changes. Denies CP, SOB, RUQ/epigastric pain. Tolerating regular diet without N/V. Endorses good FM. Denies any CTXs, VB, or LOF. Patient endorses history of anxiety/depression but feels as though she is coping ok now and would be more anxious taking another medication during the pregnancy.   O: Vitals:   09/02/21 0007 09/02/21 0420 09/02/21 0739 09/02/21 1147  BP: (!) 149/80 (!) 145/82 133/79 (!) 144/83  Pulse: 77 77 74 76  Resp: 18 17 18 19   Temp: 98.9 F (37.2 C) 97.7 F (36.5 C) 99 F (37.2 C) (!) 97.5 F (36.4 C)  TempSrc: Oral Oral Oral Oral  SpO2:  96% 99% 100%  Weight:  106 kg    Lucero:       Physical Exam: -General: AAO, NAD -Heart: RRR -Lungs: CTABL no audible wheezes, rales, or crackles -Abdomen: gravid uterus, non-tender to palpation -Extremities: Neg LE edema  Fetal Monitoring: -NST: reactive for gestational age, baseline 135 bpm mod variability +10x10 accels, no decelerations -Toco: acontractile  Growth [place] 1/12: breech EFW 4.5% with UAD WNL   1/16 UAD pending  A/P: Brianna Lucero 46 y.o. G1P0 at [redacted]w[redacted]d HD#5 admitted with cHTN exacerbation and IUGR, now with labile BP and reassuring fetal status  cHTN exacerbation -BP's reviewed over past 24hrs: primarily 140-150s/90s with intermittent severe range. Last night pt did have sustained severe pressure just before 8PM and was given antihypertensive regimen early with return to baseline BP within 1 hour of medication. Pt admits to increased anxiety at that time regarding Bps. Will continue on PO Labetalol 600 mg q 8hr and Procardia 30XL in AM and 60XL in PM for now. Discussed increasing dosing for recurrent severe range BP and patient agrees -Last PIH labs done on admission 1/12 WNL, repeat  today results pending -Continue daily ASA 81mg  PEC ppx IUGR -Last growth [place] 1/12 EFW 4.5% with normal UAD- appreciate MFM recs as follows: repeat growth [place] in 3 weeks and UAD twice weekly. F/u UAD report pending from today -S/p BMZ 1/12-1/13 -NST q shift, reactive this morning IVF pregnancy (donor egg) Anxiety -We discussed risks and benefits of pharmacotherapy in pregnancy. Patient aware Zoloft and/or Buspar remains an option with excellent safety profile in pregnancy if desires. 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

## 2021-09-03 NOTE — Progress Notes (Signed)
NST noted  140s-150s variability minimal, 10x10 accels and no decels - reactive for 24 wk Toco none  BP (!) 156/86 (BP Location: Left Arm)    Pulse 75    Temp 98.7 F (37.1 C) (Oral)    Resp 18    Ht 5\' 6"  (1.676 m)    Wt 105.9 kg    SpO2 98%    BMI 37.69 kg/m

## 2021-09-03 NOTE — Progress Notes (Signed)
Brianna Lucero 46 y.o. G1P0 at [redacted]w[redacted]d HD#6 admitted with cHTN exacerbation and IUGR  S: Patient doing well this morning. She admits to mild HA after taking PM Procardia dosing but denies any this morning or any associated vision changes. Denies CP, SOB, RUQ/epigastric pain. Tolerating regular diet without N/V. Endorses good FM. Denies any CTXs, VB, or LOF. Patient anxious for discharge home  O: Vitals:   09/02/21 2355 09/03/21 0001 09/03/21 0446 09/03/21 0742  BP: (!) 169/84 (!) 158/76 132/63 (!) 143/85  Pulse: 83 79 78 79  Resp: 18  17 18   Temp: 98.8 F (37.1 C)  98.5 F (36.9 C) 98.9 F (37.2 C)  TempSrc: Oral  Oral Oral  SpO2: 97%  100% 98%  Weight:   105.9 kg   Lucero:      CBC    Component Value Date/Time   WBC 10.0 09/02/2021 1222   RBC 4.19 09/02/2021 1222   HGB 12.0 09/02/2021 1222   HCT 36.2 09/02/2021 1222   PLT 203 09/02/2021 1222   MCV 86.4 09/02/2021 1222   MCH 28.6 09/02/2021 1222   MCHC 33.1 09/02/2021 1222   RDW 14.6 09/02/2021 1222   CMP     Component Value Date/Time   NA 136 09/02/2021 1222   K 3.7 09/02/2021 1222   CL 106 09/02/2021 1222   CO2 24 09/02/2021 1222   GLUCOSE 92 09/02/2021 1222   BUN 10 09/02/2021 1222   CREATININE 0.85 09/02/2021 1222   CALCIUM 8.9 09/02/2021 1222   PROT 5.6 (L) 09/02/2021 1222   ALBUMIN 2.9 (L) 09/02/2021 1222   AST 23 09/02/2021 1222   ALT 26 09/02/2021 1222   ALKPHOS 54 09/02/2021 1222   BILITOT 0.5 09/02/2021 1222   GFRNONAA >60 09/02/2021 1222    physical Exam: -General: AAO, NAD -Heart: RRR -Lungs: CTABL no audible wheezes, rales, or crackles -Abdomen: gravid uterus, non-tender to palpation -Extremities: Neg LE edema -Neuro: non focal  -Skin, intact  Fetal Monitoring: -NST: reactive for gestational age, baseline 135 bpm mod variability +10x10 accels, no recurrent decelerations occ rare mild variable -Toco: acontractile  Growth [place] 1/12: breech EFW 4.5% with UAD WNL   1/16 - BPP 8/8 with nl AFI  and AEDF on UAD no reversal  A/P: Brianna Lucero 46 y.o. G1P0 at [redacted]w[redacted]d HD#6 admitted with cHTN exacerbation and IUGR, now with labile BP and reassuring fetal status  cHTN exacerbation -BP's reviewed over past 24hrs: primarily 140-150s/90s with intermittent diastolic in 462M. Will continue on PO Labetalol 600 mg q 8hr and Procardia 30XL in AM and 60XL in PM for now. Discussed increasing dosing for recurrent severe range BP and patient agrees -Last PIH labs done on 1/16 WNL, -Continue daily ASA 81mg  PEC ppx IUGR -Last growth [place] 1/12 EFW 4.5% with normal UAD- appreciate MFM recs as follows: repeat growth [place] in 3 weeks and UAD twice weekly. 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 Note to MFM for dc criteria.   Brianna Lucero J 09/03/21 9:41 AM

## 2021-09-04 NOTE — Progress Notes (Signed)
Karalee Height 46 y.o. G1P0 at [redacted]w[redacted]d HD#7 admitted with CHTN exacerbation and IUGR AMA, IVF (donor), Fibroids, Breech   S: HA few minutes after Procardia in evening. No other complaints. No swelling   O: Patient Vitals for the past 24 hrs:  BP Temp Temp src Pulse Resp SpO2 Weight  09/04/21 0527 129/69 98.5 F (36.9 C) Oral 71 16 100 % 105.8 kg  09/03/21 2336 (!) 149/66 99 F (37.2 C) Oral 80 16 100 % --  09/03/21 1955 (!) 156/86 98.7 F (37.1 C) Oral 75 18 100 % --  09/03/21 1607 (!) 157/84 98.2 F (36.8 C) Oral 79 18 98 % --  09/03/21 1131 (!) 143/78 98.9 F (37.2 C) Oral 71 18 98 % --    Labs 1/16- CBC, CMP   Exam: -General: AAO, NAD -Heart: RRR -Lungs: CTABL no audible wheezes, rales, or crackles -Abdomen: gravid uterus, non-tender to palpation -Extremities: Neg LE edema. DTR +2/+2 -Neuro: non focal  -Skin, intact  Fetal Monitoring: -NST: reactive for gestational age, baseline 135 bpm mod variability +10x10 accels, 3 mild variable decels noted this morning (none on last NST)  -Toco: acontractile  Growth [place] 1/12: breech EFW 4.5% with UAD WNL   1/16 - BPP 8/8 with nl AFI and intermittent AEDF on UAD but no reversal  A/P: Karalee Height 46 y.o. G1P0 at [redacted]w[redacted]d admitted with cHTN exacerbation and IUGR, now with labile BP and reassuring fetal status but UAD worse compared with admission UAD   CHTN exacerbation -BP's improving, none in severe range in 24 hours. Continue PO Labetalol 600 mg q 8hr and Procardia 30XL in AM and 60XL in PM. Discussed increasing dosing for recurrent severe range BPs -Last PIH labs done on 1/16 WNL, can now space out to q week  -Continue daily ASA 81mg  PEC ppx IUGR -Last growth [place] 1/12 EFW 4.5% with normal UAD- appreciate MFM recs as follows: repeat growth [place] in 3 weeks and UAD twice weekly. F/u UAD AEDF but no reversal -S/p BMZ 1/12-1/13 -NST q shift 3. IVF pregnancy (donor egg) 4. Uterine fibroids 5. Breech- for C/section  6. Hx of HSV no  active lesions, requests to take BID Acyclovir (though pharmacy advised TID) Continue inpatient care until cleared for discharge with outpatient monitoring per MFM recommendations. Note to MFM for dc criteria. Pt desires discharge but understands in light for extreme prematurity and recent UAD w/ intermittent AEDF, more close/frrequent NST needed. Will d/w MFM tomorrow after f/up UAD, BPP  Elveria Royals 09/04/21

## 2021-09-05 ENCOUNTER — Inpatient Hospital Stay (HOSPITAL_BASED_OUTPATIENT_CLINIC_OR_DEPARTMENT_OTHER): Payer: BC Managed Care – PPO

## 2021-09-05 DIAGNOSIS — O365920 Maternal care for other known or suspected poor fetal growth, second trimester, not applicable or unspecified: Secondary | ICD-10-CM

## 2021-09-05 DIAGNOSIS — E668 Other obesity: Secondary | ICD-10-CM | POA: Diagnosis not present

## 2021-09-05 DIAGNOSIS — O365910 Maternal care for other known or suspected poor fetal growth, first trimester, not applicable or unspecified: Secondary | ICD-10-CM

## 2021-09-05 DIAGNOSIS — Z3A24 24 weeks gestation of pregnancy: Secondary | ICD-10-CM

## 2021-09-05 DIAGNOSIS — O99212 Obesity complicating pregnancy, second trimester: Secondary | ICD-10-CM

## 2021-09-05 LAB — TYPE AND SCREEN
ABO/RH(D): O POS
Antibody Screen: NEGATIVE

## 2021-09-05 MED ORDER — LABETALOL HCL 5 MG/ML IV SOLN
40.0000 mg | INTRAVENOUS | Status: DC | PRN
  Administered 2021-09-20: 40 mg via INTRAVENOUS
  Filled 2021-09-05: qty 8

## 2021-09-05 MED ORDER — HYDRALAZINE HCL 20 MG/ML IJ SOLN: 10.0000 mg | INTRAMUSCULAR | Status: DC | PRN

## 2021-09-05 MED ORDER — HYDRALAZINE HCL 20 MG/ML IJ SOLN
5.0000 mg | INTRAMUSCULAR | Status: DC | PRN
  Administered 2021-09-20: 5 mg via INTRAVENOUS
  Filled 2021-09-05: qty 1

## 2021-09-05 MED ORDER — LABETALOL HCL 5 MG/ML IV SOLN
20.0000 mg | INTRAVENOUS | Status: DC | PRN
  Administered 2021-09-20: 20 mg via INTRAVENOUS
  Filled 2021-09-05: qty 4

## 2021-09-05 NOTE — Progress Notes (Signed)
Initial Nutrition Assessment  DOCUMENTATION CODES:  Obesity unspecified  INTERVENTION:  Regular Diet Pt may order double protein portions and snacks TID if she makes request when ordering meals   NUTRITION DIAGNOSIS:   Increased nutrient needs related to  (pregnancy and fetal growth requirements) as evidenced by  (24 weeks IUP).  GOAL:  Patient will meet greater than or equal to 90% of their needs   MONITOR:  Weight trends  REASON FOR ASSESSMENT:  Antenatal, LOS    ASSESSMENT:  24 6/7 weeks IUP, adm due to IUGR, cHTN, IVF. wt at 11 weeks, 225 lbs, BMI 36. 8 lb weight gain to date   Diet Order:   Diet Order             Diet regular Room service appropriate? Yes; Fluid consistency: Thin  Diet effective now                   EDUCATION NEEDS:   No education needs have been identified at this time  Skin:  Skin Assessment: Reviewed RN Assessment    Height:   Ht Readings from Last 1 Encounters:  08/29/21 5\' 6"  (1.676 m)    Weight:   Wt Readings from Last 1 Encounters:  09/05/21 105.8 kg    Ideal Body Weight:   130 lbs  BMI:  Body mass index is 37.64 kg/m.  Estimated Nutritional Needs:   Kcal:  2000-2200  Protein:  90-100 g  Fluid:  > 2.2 L

## 2021-09-05 NOTE — Consult Note (Signed)
Neonatology Antenatal Consultation Requested by: Almquist Reason: expected preterm delivery, currently EGA [redacted] weeks, complicated by maternal hypertension, IUGR and unfavorable umbilical artery Doppler studies   I discussed the usual care of babies born prematurely at 93 weeks, the increased risk of mortality, intracranial hemorrhage, infection, and increased risk for neurocognitive impairment.  She had just finished speaking with Dr. Gertie Exon about the fetal Doppler studies and was tearful.  I told her that longer gestations generally improved the outlook.  I understand the plan is to continue close monitoring of the fetus.  She knows that we can return to ask any further questions she has about neonatal intensive care and our expectations for management.  R.L. Darrelyn Hillock.D.

## 2021-09-05 NOTE — Progress Notes (Signed)
Brianna Lucero 46 y.o. G1P0 at [redacted]w[redacted]d HD#8 admitted with [facility] with gestational exacerbation requiring significant increase in anti-hypertensives and IUGR and now development of persistently absent EDF.   AMA, IVF (donor), Fibroids, Breech   S: Pt notes no current HA, good FM, no LOF, no VB, no ctx.   O: Patient Vitals for the past 24 hrs:  BP Temp Temp src Pulse Resp SpO2 Weight  09/05/21 1422 (!) 143/75 -- -- 72 -- 98 % --  09/05/21 1138 (!) 148/81 98.6 F (37 C) Oral 69 18 99 % --  09/05/21 0742 (!) 120/58 98.9 F (37.2 C) Oral 71 18 95 % --  09/05/21 0555 (!) 142/84 -- -- 76 18 98 % --  09/05/21 0418 (!) 140/59 98.3 F (36.8 C) Oral 75 18 98 % 105.8 kg  09/04/21 2334 (!) 157/86 98.6 F (37 C) Oral 75 18 98 % --  09/04/21 2203 (!) 148/78 -- -- 79 (!) 79 -- --  09/04/21 1949 (!) 142/82 99 F (37.2 C) Oral 74 20 98 % --     Labs 1/16- CBC, CMP normal  Exam: -General: AAO, NAD -Heart: RRR -Lungs: CTABL no audible wheezes, rales, or crackles -Abdomen: gravid uterus, non-tender to palpation -Extremities: Neg LE edema. DTR +2/+2 -Neuro: non focal  -Skin, intact  Fetal Monitoring: 10 pm 1/18 NST: reactive for gestational age, baseline 130 bpm, 5-8 beat variability +10x10 accels, rare quick variable decels <10 seconds.  -Toco: acontractile  1030 am 1/19 NST: 10 pm 1/18 NST: reactive for gestational age, baseline 130 bpm, 5-8 beat variability +10x10 accels, rare quick variable decels <10 seconds.  -Toco: acontractile  Growth [place] 1/12: breech EFW 4.5% with UAD WNL   1/16 - BPP 8/8 with nl AFI and intermittent AEDF on UAD but no reversal 1/19- BPP not done, MVP 3.6, Persistent AEDF, no reversal  A/P: Brianna Lucero 46 y.o. G1P0 at [redacted]w[redacted]d admitted with cHTN exacerbation and IUGR, now with stable elevated bps, w/o severe range bps, no evidence of PEC and IUGR with continuing worsening of UA dopplers. Cont tid NST, 2/ wk NST. If dopplers reversed plan rescue dose steroid and would  consult again with MFM on delivery plan given early GA. Pt aware in-house til delivery, c/s for MOD, and would proceed with urgent c/s for NRFHT. Weekly labs to eval for PEC, sooner with si/sx or recurrent severe range bps.   NICU consult ordered and pending.   CHTN exacerbation -BP's stable , none in severe range in 24 hours. Continue PO Labetalol 600 mg q 8hr and Procardia 30XL in AM and 60XL in PM. -Last PIH labs done on 1/16 WNL, can now space out to q week  -Continue daily ASA 81mg  PEC ppx IUGR -Last growth [place] 1/12 EFW 4.5% with normal UAD- appreciate MFM recs as follows: repeat growth [place] in 3 weeks and UAD twice weekly. F/u UAD AEDF but no reversal -S/p BMZ 1/12-1/13 -NST q shift 3. IVF pregnancy (donor egg) 4. Uterine fibroids 5. Breech- for C/section  6. 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  Ala Dach 09/05/21

## 2021-09-05 NOTE — Consult Note (Signed)
Follow up MFM consult  I spoke with Ms. Dragan telephonically at the request of Dr. Murrell Redden regarding the change in UA Dopplers  Antenatal testing due to IUGR with an EFW 4.5 th% with an AC < 5th%. UA Dopplers are persistently absent without evidence of REDF.  I discussed today's results with Dr. Carin Hock and Ms. Araceli Bouche.   We recommend continued hosplitalization given her htn and new transition to persistent AEDF.  I spent 20 minutes with >50% in direct communication with Ms. Laursen and coordination of care with Dr. Carin Hock.  All questions answered.  Vikki Ports, MD

## 2021-09-05 NOTE — Plan of Care (Signed)
Problem: Nutrition: Goal: Adequate nutrition will be maintained Outcome: Completed/Met   Problem: Coping: Goal: Level of anxiety will decrease Outcome: Completed/Met   Problem: Elimination: Goal: Will not experience complications related to bowel motility Outcome: Completed/Met Goal: Will not experience complications related to urinary retention Outcome: Completed/Met   Problem: Education: Goal: Knowledge of disease or condition will improve Outcome: Completed/Met Goal: Knowledge of the prescribed therapeutic regimen will improve Outcome: Completed/Met   Problem: Education: Goal: Knowledge of disease or condition will improve Outcome: Completed/Met

## 2021-09-05 NOTE — Progress Notes (Signed)
No c/o; tearful after talking with Dr Gertie Exon, was hoping for better doppler results   Patient Vitals for the past 24 hrs:  BP Temp Temp src Pulse Resp SpO2 Weight  09/05/21 1422 (!) 143/75 -- -- 72 -- 98 % --  09/05/21 1138 (!) 148/81 98.6 F (37 C) Oral 69 18 99 % --  09/05/21 0742 (!) 120/58 98.9 F (37.2 C) Oral 71 18 95 % --  09/05/21 0555 (!) 142/84 -- -- 76 18 98 % --  09/05/21 0418 (!) 140/59 98.3 F (36.8 C) Oral 75 18 98 % 105.8 kg  09/04/21 2334 (!) 157/86 98.6 F (37 C) Oral 75 18 98 % --  09/04/21 2203 (!) 148/78 -- -- 79 (!) 79 -- --  09/04/21 1949 (!) 142/82 99 F (37.2 C) Oral 74 20 98 % --    U/s: 1/19: fht 137, transverse (head maternal right), anterior placenta, afi largest pocket 3.6cm Dopplers persistently absent w/o REDF  Continue 2x weekly UA Dopplers with daily NST.  Consider rescue steroids if UA dopplers become reversed  A/P: Karalee Height 46 y.o. G1P0 at [redacted]w[redacted]d admitted with cHTN exacerbation and IUGR, now with labile BP and abnormal dopplers   CHTN exacerbation -BP's improving, none in severe range in 24 hours. Continue PO Labetalol 600 mg q 8hr and Procardia 30XL in AM and 60XL in PM.  -Last PIH labs done on 1/16 WNL, wkly labs -Continue daily ASA 81mg  PEC ppx IUGR -Last growth [place] 1/12 EFW 4.5% with normal UAD; repeat growth [place] in 3 weeks and UAD twice weekly.  UAD AEDF but no reversal - continue 2x wkly dopplers; spoke with Dr Gertie Exon today and MFM recommendation that patient stay inpatient d/t AEDF, contin TID NST, he then called patient to speak directly to her re: today's findings and inpatient recommendations  -S/p BMZ 1/12-1/13 -NST q shift 3. IVF pregnancy (donor egg) 4. Uterine fibroids 5. transverse- for C/section  6. 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

## 2021-09-06 NOTE — Progress Notes (Signed)
Brianna Lucero 46 y.o. G1P0 at 25.0wga HD#9 admitted with [facility] with gestational exacerbation requiring significant increase in anti-hypertensives and IUGR and now development of persistently absent EDF.    AMA, IVF (donor), Fibroids, transverse    No c/o; was upset about dopplers last night, thinks may have contributed to elevated bp; no h/a, vision changes, ruq pain, no ctx/vb/lof   Patient Vitals for the past 24 hrs:  BP Temp Temp src Pulse Resp SpO2 Weight  09/06/21 0815 (!) 144/82 98.4 F (36.9 C) Oral 74 18 99 % --  09/06/21 0603 -- -- -- -- -- -- 105.4 kg  09/06/21 0424 134/67 98.3 F (36.8 C) Oral 79 18 99 % --  09/05/21 2228 (!) 156/86 -- -- 75 -- -- --  09/05/21 2114 (!) 166/85 -- -- 78 -- -- --  09/05/21 1931 (!) 162/89 99 F (37.2 C) Oral 76 16 100 % --  09/05/21 1540 (!) 155/78 98.2 F (36.8 C) Oral 72 18 100 % --  09/05/21 1422 (!) 143/75 -- -- 72 -- 98 % --   A&ox3 Rrr Nml respirations Abd: soft,nt, gravid LE: no edema, nt bilat  NST x3 reviewed FHT: 130s, nml variability, occ accel, occ quick/short variable; reassuring for gestational age TOCO: none  Growth [place] 1/12: breech EFW 4.5% with UAD WNL    1/16 - BPP 8/8 with nl AFI and intermittent AEDF on UAD but no reversal 1/19- BPP not done, MVP 3.6, Persistent AEDF, no reversal, transverse  A/p: iup at 25.0wga CHTN exacerbation -BP's stable , none in severe range in 24 hours. Continue PO Labetalol 600 mg q 8hr and Procardia 30XL in AM and 60XL in PM. -Last PIH labs done on 1/16 WNL, now wkly  -Continue daily ASA 81mg  PEC ppx IUGR -Last growth [place] 1/12 EFW 4.5% with normal UAD- plan repeat growth [place] in 3 weeks and UAD twice weekly, next on Monday -S/p BMZ 1/12-1/13 -NST q shift 3. IVF pregnancy (donor egg) 4. Uterine fibroids 5. transverse- for C/section  6. 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

## 2021-09-07 ENCOUNTER — Encounter (HOSPITAL_COMMUNITY): Payer: Self-pay | Admitting: Obstetrics and Gynecology

## 2021-09-07 MED ORDER — SODIUM CHLORIDE 0.9% FLUSH
3.0000 mL | Freq: Two times a day (BID) | INTRAVENOUS | Status: DC
  Administered 2021-09-07 – 2021-09-15 (×16): 3 mL via INTRAVENOUS
  Administered 2021-09-15: 2.5 mL via INTRAVENOUS
  Administered 2021-09-16 – 2021-09-20 (×6): 3 mL via INTRAVENOUS

## 2021-09-07 NOTE — Progress Notes (Signed)
Brianna Lucero 46 y.o. G3P0020 at [redacted]w[redacted]d HD#1 admitted with cHTN exacerbation and IUGR  S: Patient doing better this morning. She feels she has come to terms with remaining inpatient and is coping ok. She reports good FM. Denies any CTXs, VB, or LOF. No HA, vision changes, RUQ/epigastric pain, CP, or SOB.   O: Vitals:   09/07/21 0808 09/07/21 1147 09/07/21 1415 09/07/21 1552  BP: 137/77 (!) 148/87 137/75 (!) 141/82  Pulse: 74 72 78 73  Resp: 18   18  Temp: 98.3 F (36.8 C)   98.7 F (37.1 C)  TempSrc: Oral   Oral  SpO2: 98%   96%  Weight:      Height:         Physical Exam: -General: AAO, NAD -Heart: RRR -Lungs: CTABL no audible wheezes, rales, or crackles -Abdomen: gravid uterus, non-tender to palpation -Extremities: Neg LE edema  CBC    Component Value Date/Time   WBC 10.0 09/02/2021 1222   RBC 4.19 09/02/2021 1222   HGB 12.0 09/02/2021 1222   HCT 36.2 09/02/2021 1222   PLT 203 09/02/2021 1222   MCV 86.4 09/02/2021 1222   MCH 28.6 09/02/2021 1222   MCHC 33.1 09/02/2021 1222   RDW 14.6 09/02/2021 1222   CMP     Component Value Date/Time   NA 136 09/02/2021 1222   K 3.7 09/02/2021 1222   CL 106 09/02/2021 1222   CO2 24 09/02/2021 1222   GLUCOSE 92 09/02/2021 1222   BUN 10 09/02/2021 1222   CREATININE 0.85 09/02/2021 1222   CALCIUM 8.9 09/02/2021 1222   PROT 5.6 (L) 09/02/2021 1222   ALBUMIN 2.9 (L) 09/02/2021 1222   AST 23 09/02/2021 1222   ALT 26 09/02/2021 1222   ALKPHOS 54 09/02/2021 1222   BILITOT 0.5 09/02/2021 1222   GFRNONAA >60 09/02/2021 1222      Fetal Monitoring: 1/20 PM 2130 NST: reactive for gestational age, baseline 135 bpm min-mod var with 10x10 accels, 2 small possible variable decels but tracing discontinuous during those times. Toco quiet 1/21 AM NST: reactive for gestational age, baseline 130 bpm mod variability +10x10 accels, no decelerations. Toco: acontractile   [place] 1/12: breech EFW 4.5% with UAD WNL   1/19 transverse head  maternal right, normal fluid, UAD with persistent AEDV without evidence of reverse flow  A/P: Brianna Lucero 46 y.o. G3P0020 at [redacted]w[redacted]d HD#10 admitted with cHTN exacerbation and IUGR, now with abnormal UAD but currently clinically stable  cHTN exacerbation now stable -BP's reviewed over past 24hrs: no severe ranges. Continue current regimen of Procardia 30AM, 60PM and Labetalol 600 q 8hr -Last [facility] labs done earlier this week 1/16 WNL -Continue daily ASA 81mg  PEC ppx IUGR with AEDV UAD Abnormality -Last growth [place] 1/12 EFW 4.5% - appreciate MFM recs as follows: repeat growth [place] in 2 weeks and UAD twice weekly.  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

## 2021-09-07 NOTE — Plan of Care (Signed)
Problem: Activity: Goal: Risk for activity intolerance will decrease Outcome: Completed/Met   Problem: Education: Goal: Knowledge of the prescribed therapeutic regimen will improve Outcome: Completed/Met

## 2021-09-08 LAB — TYPE AND SCREEN
ABO/RH(D): O POS
Antibody Screen: NEGATIVE

## 2021-09-08 NOTE — Progress Notes (Signed)
Brianna Lucero 46 y.o. G3P0020 at [redacted]w[redacted]d HD#11 admitted with cHTN exaceration and IUGR  S: Patient doing well today. Did have an episode of low BP early this morning but denies symptoms with that and Bps have returned to patient baseline since then. Denies any HA, vision changes, CP, SOB, RUQ/epigastric pain. Reports good FM. No CTXs, VB, or LOF.  O: Vitals:   09/08/21 0458 09/08/21 0605 09/08/21 0816 09/08/21 1143  BP:  136/85 135/80 (!) 148/93  Pulse:  78 75 70  Resp:   18 18  Temp:   98.6 F (37 C) 99 F (37.2 C)  TempSrc:   Oral Oral  SpO2:   98% 98%  Weight: 106.6 kg     Height:       Physical Exam: -General: AAO, NAD -Heart: RRR -Lungs: CTABL no audible wheezes, rales, or crackles -Abdomen: gravid uterus, non-tender to palpation -Extremities: Neg LE edema  CBC Latest Ref Rng & Units 09/02/2021 08/29/2021  WBC 4.0 - 10.5 K/uL 10.0 7.4  Hemoglobin 12.0 - 15.0 g/dL 12.0 12.0  Hematocrit 36.0 - 46.0 % 36.2 37.7  Platelets 150 - 400 K/uL 203 203   CMP Latest Ref Rng & Units 09/02/2021 08/29/2021  Glucose 70 - 99 mg/dL 92 95  BUN 6 - 20 mg/dL 10 8  Creatinine 0.44 - 1.00 mg/dL 0.85 0.82  Sodium 135 - 145 mmol/L 136 137  Potassium 3.5 - 5.1 mmol/L 3.7 3.9  Chloride 98 - 111 mmol/L 106 105  CO2 22 - 32 mmol/L 24 23  Calcium 8.9 - 10.3 mg/dL 8.9 9.4  Total Protein 6.5 - 8.1 g/dL 5.6(L) 5.9(L)  Total Bilirubin 0.3 - 1.2 mg/dL 0.5 0.2(L)  Alkaline Phos 38 - 126 U/L 54 53  AST 15 - 41 U/L 23 35  ALT 0 - 44 U/L 26 36     Fetal Monitoring: 1/21 PM 2200 NST: reactive for gestational age, baseline 130 bpm min-mod var with 10x10 accels, rare variable decel- non-recurrent. Toco quiet 1/22 AM NST: reactive for gestational age, baseline 135 bpm mod variability +10x10 accels, Rare variable decel- non-recurrent. Toco: acontractile   Brianna Lucero 1/12: breech EFW 4.5% with UAD WNL    1/19 transverse head maternal right, normal fluid, UAD with persistent AEDV without evidence of reverse  flow  A/P: November Brianna Lucero 46 y.o. G3P0020 at [redacted]w[redacted]d HD#11 admitted with cHTN exacerbation and IUGR, now with stable BP but IUGR with abnormal UAD showing persistent AEDV    cHTN exacerbation now stable -BP's reviewed over past 24hrs: no severe ranges. Continue current regimen of Procardia 30AM, 60PM and Labetalol 600 q 8hr -Last PI[facility] labs done earlier this week 1/16 WNL -Continue daily ASA 81mg  PEC ppx IUGR with AEDV UAD Abnormality -Last growth Brianna Lucero 1/12 EFW 4.5% - appreciate MFM recs as follows: repeat growth Brianna Lucero in 2 weeks and UAD twice weekly.  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

## 2021-09-09 ENCOUNTER — Inpatient Hospital Stay (HOSPITAL_BASED_OUTPATIENT_CLINIC_OR_DEPARTMENT_OTHER): Payer: BC Managed Care – PPO

## 2021-09-09 DIAGNOSIS — Z3A25 25 weeks gestation of pregnancy: Secondary | ICD-10-CM

## 2021-09-09 DIAGNOSIS — O09522 Supervision of elderly multigravida, second trimester: Secondary | ICD-10-CM

## 2021-09-09 DIAGNOSIS — O365920 Maternal care for other known or suspected poor fetal growth, second trimester, not applicable or unspecified: Secondary | ICD-10-CM

## 2021-09-09 LAB — COMPREHENSIVE METABOLIC PANEL
ALT: 18 U/L (ref 0–44)
AST: 22 U/L (ref 15–41)
Albumin: 3 g/dL — ABNORMAL LOW (ref 3.5–5.0)
Alkaline Phosphatase: 66 U/L (ref 38–126)
Anion gap: 7 (ref 5–15)
BUN: 11 mg/dL (ref 6–20)
CO2: 25 mmol/L (ref 22–32)
Calcium: 9.1 mg/dL (ref 8.9–10.3)
Chloride: 105 mmol/L (ref 98–111)
Creatinine, Ser: 1.05 mg/dL — ABNORMAL HIGH (ref 0.44–1.00)
GFR, Estimated: >60 mL/min (ref >60)
Glucose, Bld: 85 mg/dL (ref 70–99)
Potassium: 4.1 mmol/L (ref 3.5–5.1)
Sodium: 137 mmol/L (ref 135–145)
Total Bilirubin: 0.3 mg/dL (ref 0.3–1.2)
Total Protein: 6 g/dL — ABNORMAL LOW (ref 6.5–8.1)

## 2021-09-09 LAB — CBC
HCT: 38.6 % (ref 36.0–46.0)
Hemoglobin: 12.4 g/dL (ref 12.0–15.0)
MCH: 28.1 pg (ref 26.0–34.0)
MCHC: 32.1 g/dL (ref 30.0–36.0)
MCV: 87.3 fL (ref 80.0–100.0)
Platelets: 211 10*3/uL (ref 150–400)
RBC: 4.42 MIL/uL (ref 3.87–5.11)
RDW: 14.6 % (ref 11.5–15.5)
WBC: 8.4 10*3/uL (ref 4.0–10.5)
nRBC: 0 % (ref 0.0–0.2)

## 2021-09-09 NOTE — Progress Notes (Addendum)
UAD noted - persistent AEDF but no reversal. Active FMs noted. AFP 5 cm  Cephalic reported. Fibroids noted.  Plan- remain IP. Dopplers twice/ wkly, growth at 3 wk interval from last (1/12).  Last PIH labs 1/16 Repeat today

## 2021-09-09 NOTE — Progress Notes (Signed)
Karalee Height 46 y.o. G1P0 at [redacted]w[redacted]d HD#12 admitted with cHTN exacerbation and IUGR  S: Patient doing well this morning. She admits to mild HA after taking PM Procardia dosing but denies any this morning or any associated vision changes. Denies CP, SOB, RUQ/epigastric pain. Tolerating regular diet without N/V. Endorses good FM. Denies any CTXs, VB, or LOF. Patient still anxious for discharge home. Had discussion with MFM.  O: Vitals:   09/08/21 2011 09/09/21 0006 09/09/21 0422 09/09/21 0423  BP: (!) 155/80 132/68 (!) 135/59   Pulse: 76 78 77   Resp: 18 18 16    Temp: 98.4 F (36.9 C) 97.6 F (36.4 C) 98.2 F (36.8 C)   TempSrc: Oral Oral Oral   SpO2: 99% 98% 96%   Weight:    105.8 kg  Height:      CBC    Component Value Date/Time   WBC 10.0 09/02/2021 1222   RBC 4.19 09/02/2021 1222   HGB 12.0 09/02/2021 1222   HCT 36.2 09/02/2021 1222   PLT 203 09/02/2021 1222   MCV 86.4 09/02/2021 1222   MCH 28.6 09/02/2021 1222   MCHC 33.1 09/02/2021 1222   RDW 14.6 09/02/2021 1222   CMP     Component Value Date/Time   NA 136 09/02/2021 1222   K 3.7 09/02/2021 1222   CL 106 09/02/2021 1222   CO2 24 09/02/2021 1222   GLUCOSE 92 09/02/2021 1222   BUN 10 09/02/2021 1222   CREATININE 0.85 09/02/2021 1222   CALCIUM 8.9 09/02/2021 1222   PROT 5.6 (L) 09/02/2021 1222   ALBUMIN 2.9 (L) 09/02/2021 1222   AST 23 09/02/2021 1222   ALT 26 09/02/2021 1222   ALKPHOS 54 09/02/2021 1222   BILITOT 0.5 09/02/2021 1222   GFRNONAA >60 09/02/2021 1222    physical Exam: -General: AAO, NAD -Heart: RRR -Lungs: CTABL no audible wheezes, rales, or crackles -Abdomen: gravid uterus, non-tender to palpation -Extremities: Neg LE edema -Neuro: non focal  -Skin, intact  Fetal Monitoring: -NST: reactive for gestational age, baseline 135 bpm mod variability +10x10 accels, no recurrent decelerations occ rare mild variable -Toco: acontractile  Growth [place] 1/12: breech EFW 4.5% with UAD WNL   1/19 -  BPP 8/8 with nl AFI and AEDF on UAD no reversal  A/P: Karalee Height 46 y.o. G1P0 at [redacted]w[redacted]d HD#12 admitted with cHTN exacerbation and IUGR, now with improved BP and reassuring fetal status  cHTN exacerbation -BP's reviewed over past 24hrs: primarily 140-150s/90s with intermittent diastolic in 800L. Will continue on PO Labetalol 600 mg q 8hr and Procardia 30XL in AM and 60XL in PM for now. D -Last PIH labs done on 1/16 WNL, Consider rpt PIH labs tomorrow -Continue daily ASA 81mg  PEC ppx IUGR -Last growth [place] 1/12 EFW 4.5% with normal UAD- appreciate MFM recs as follows: repeat growth [place] in 3 weeks and UAD twice weekly. F/u UAD AEDF but no reversal. 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    Geo Slone J 09/09/21 9:44 AM

## 2021-09-10 LAB — PROTEIN / CREATININE RATIO, URINE
Creatinine, Urine: 33.09 mg/dL
Protein Creatinine Ratio: 0.24 mg/mg{Cre} — ABNORMAL HIGH (ref 0.00–0.15)
Total Protein, Urine: 8 mg/dL

## 2021-09-10 NOTE — Progress Notes (Signed)
Lavetta Geier 46 y.o. G3P0020 at 25w 4d HD#13 admitted with cHTN exacerbation and IUGR  S: No new concerns. Accepting long hosp stay. + FMs. No UCs/ VB/ LOF. No HA, vision changes, RUQ/epigastric pain, CP, or SOB.   O: Vitals:   09/10/21 0426 09/10/21 0524 09/10/21 0819 09/10/21 1205  BP: 119/65 132/70 119/75 (!) 149/75  Pulse: 79 81 73 71  Resp: 18 18 17 18   Temp: 98.9 F (37.2 C)  98.5 F (36.9 C) 98.2 F (36.8 C)  TempSrc: Oral  Oral Oral  SpO2: 100%  97% 100%  Weight: 106.6 kg     Height:         Physical Exam: -General: AAO, NAD -Heart: RRR -Lungs: CTABL -Abdomen: gravid uterus, non-tender to palpation -Extremities: Neg LE edema, non tender    CBC Latest Ref Rng & Units 09/09/2021 09/02/2021 08/29/2021  WBC 4.0 - 10.5 K/uL 8.4 10.0 7.4  Hemoglobin 12.0 - 15.0 g/dL 12.4 12.0 12.0  Hematocrit 36.0 - 46.0 % 38.6 36.2 37.7  Platelets 150 - 400 K/uL 211 203 203    CMP Latest Ref Rng & Units 09/09/2021 09/02/2021 08/29/2021  Glucose 70 - 99 mg/dL 85 92 95  BUN 6 - 20 mg/dL 11 10 8   Creatinine 0.44 - 1.00 mg/dL 1.05(H) 0.85 0.82  Sodium 135 - 145 mmol/L 137 136 137  Potassium 3.5 - 5.1 mmol/L 4.1 3.7 3.9  Chloride 98 - 111 mmol/L 105 106 105  CO2 22 - 32 mmol/L 25 24 23   Calcium 8.9 - 10.3 mg/dL 9.1 8.9 9.4  Total Protein 6.5 - 8.1 g/dL 6.0(L) 5.6(L) 5.9(L)  Total Bilirubin 0.3 - 1.2 mg/dL 0.3 0.5 0.2(L)  Alkaline Phos 38 - 126 U/L 66 54 53  AST 15 - 41 U/L 22 23 35  ALT 0 - 44 U/L 18 26 36    Urine p/c on 1/23 0.24     Fetal Monitoring: 1/23 PM NST: reactive for gestational age, baseline 135 bpm min-mod var with 10x10 accels, random occ small variable decels. Toco no UCs 1/24 AM NST: reactive for gestational age, baseline 130 bpm mod variability +10x10 accels, no decelerations. Toco: acontractile   [place] 1/12: Growth at admission, breech EFW 1'2" at 4.5% AC 5% and UAD WNL   1/19 Transverse head maternal right, normal fluid, UAD with persistent AEDV without evidence  of reverse flow 9/51 Cephalic, UAD AEDF but no reversal, nl AFpocket   A/P: Yue Flanigan 46 y.o. G3P0020 at [redacted]w[redacted]d HD#13 admitted with cHTN exacerbation and IUGR, now AEDF UAD but currently clinically stable  CHTN exacerbation now stable -BP's stable, Continue current regimen of Procardia 30XL AM, 60XL PM and Labetalol 600 q 8hr -Last PI[facility] labs 1/23, creatinine a bit higher, pt reports not hydrating well. Urine P/C 0.24  -Continue daily ASA 81mg  PEC ppx IUGR with AEDF- UAD Abnormality -Last growth [place] 1/12 EFW 4.5% - appreciate MFM recs as follows: repeat growth [place] in 2 weeks and UAD twice weekly.  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

## 2021-09-11 LAB — CBC WITH DIFFERENTIAL/PLATELET
Abs Immature Granulocytes: 0.02 10*3/uL (ref 0.00–0.07)
Basophils Absolute: 0 10*3/uL (ref 0.0–0.1)
Basophils Relative: 0 %
Eosinophils Absolute: 0.1 10*3/uL (ref 0.0–0.5)
Eosinophils Relative: 2 %
HCT: 36.1 % (ref 36.0–46.0)
Hemoglobin: 12 g/dL (ref 12.0–15.0)
Immature Granulocytes: 0 %
Lymphocytes Relative: 27 %
Lymphs Abs: 1.9 10*3/uL (ref 0.7–4.0)
MCH: 28.4 pg (ref 26.0–34.0)
MCHC: 33.2 g/dL (ref 30.0–36.0)
MCV: 85.5 fL (ref 80.0–100.0)
Monocytes Absolute: 0.8 10*3/uL (ref 0.1–1.0)
Monocytes Relative: 11 %
Neutro Abs: 4.1 10*3/uL (ref 1.7–7.7)
Neutrophils Relative %: 60 %
Platelets: 186 10*3/uL (ref 150–400)
RBC: 4.22 MIL/uL (ref 3.87–5.11)
RDW: 14.2 % (ref 11.5–15.5)
WBC: 7 10*3/uL (ref 4.0–10.5)
nRBC: 0 % (ref 0.0–0.2)

## 2021-09-11 LAB — TYPE AND SCREEN
ABO/RH(D): O POS
Antibody Screen: NEGATIVE

## 2021-09-11 LAB — COMPREHENSIVE METABOLIC PANEL
ALT: 18 U/L (ref 0–44)
AST: 27 U/L (ref 15–41)
Albumin: 2.7 g/dL — ABNORMAL LOW (ref 3.5–5.0)
Alkaline Phosphatase: 60 U/L (ref 38–126)
Anion gap: 3 — ABNORMAL LOW (ref 5–15)
BUN: 9 mg/dL (ref 6–20)
CO2: 24 mmol/L (ref 22–32)
Calcium: 8.9 mg/dL (ref 8.9–10.3)
Chloride: 105 mmol/L (ref 98–111)
Creatinine, Ser: 0.89 mg/dL (ref 0.44–1.00)
GFR, Estimated: >60 mL/min (ref >60)
Glucose, Bld: 86 mg/dL (ref 70–99)
Potassium: 4.2 mmol/L (ref 3.5–5.1)
Sodium: 132 mmol/L — ABNORMAL LOW (ref 135–145)
Total Bilirubin: 0.9 mg/dL (ref 0.3–1.2)
Total Protein: 5.6 g/dL — ABNORMAL LOW (ref 6.5–8.1)

## 2021-09-11 NOTE — Progress Notes (Signed)
Brianna Lucero 46 y.o. G3P0020 at 25w 5d HD#14 admitted with cHTN exacerbation and IUGR  S: No new concerns. Accepting long hosp stay. + FMs. No UCs/ VB/ LOF.  Patient notes daily headache at about 4 AM when she wakes to void.  This headache resolves on its own.  No vision changes, no RUQ/epigastric pain, CP, or SOB.   O: Vitals:   09/11/21 0450 09/11/21 0507 09/11/21 0827 09/11/21 1139  BP: (!) 129/55 124/60 132/71 (!) 141/78  Pulse: 81 74 81 80  Resp: 18 18 17 18   Temp: 97.9 F (36.6 C)  98.2 F (36.8 C) 98 F (36.7 C)  TempSrc: Oral  Oral Oral  SpO2: 98% 100% 99% 99%  Weight: 105.9 kg     Height:         Physical Exam: -General: AAO, NAD  -Abdomen: gravid uterus, non-tender to palpation.  No fundal tenderness.  No right upper quadrant pain -Extremities: 1+ pitting edema, non tender    CBC Latest Ref Rng & Units 09/11/2021 09/09/2021 09/02/2021  WBC 4.0 - 10.5 K/uL 7.0 8.4 10.0  Hemoglobin 12.0 - 15.0 g/dL 12.0 12.4 12.0  Hematocrit 36.0 - 46.0 % 36.1 38.6 36.2  Platelets 150 - 400 K/uL 186 211 203    CMP Latest Ref Rng & Units 09/11/2021 09/09/2021 09/02/2021  Glucose 70 - 99 mg/dL 86 85 92  BUN 6 - 20 mg/dL 9 11 10   Creatinine 0.44 - 1.00 mg/dL 0.89 1.05(H) 0.85  Sodium 135 - 145 mmol/L 132(L) 137 136  Potassium 3.5 - 5.1 mmol/L 4.2 4.1 3.7  Chloride 98 - 111 mmol/L 105 105 106  CO2 22 - 32 mmol/L 24 25 24   Calcium 8.9 - 10.3 mg/dL 8.9 9.1 8.9  Total Protein 6.5 - 8.1 g/dL 5.6(L) 6.0(L) 5.6(L)  Total Bilirubin 0.3 - 1.2 mg/dL 0.9 0.3 0.5  Alkaline Phos 38 - 126 U/L 60 66 54  AST 15 - 41 U/L 27 22 23   ALT 0 - 44 U/L 18 18 26     Urine p/c on 1/23 0.24     Fetal Monitoring: 1/24 PM NST: reactive for gestational age, baseline 135 bpm.  8 beat variability with 10x10 accels, occ small variable decels. Toco no UCs 1/25 AM NST: reactive for gestational age, baseline 130 bpm 8 beat variability +10x10 accels, occasional sharp variables.  toco: acontractile   [place] 1/12:  Growth at admission, breech EFW 1'2" at 4.5% AC 5% and UAD WNL   1/19 Transverse head maternal right, normal fluid, UAD with persistent AEDV without evidence of reverse flow 3/50 Cephalic, UAD AEDF but no reversal, nl AFpocket   A/P: Brianna Lucero 46 y.o. G3P0020 at [redacted]w[redacted]d HD#14 admitted with cHTN exacerbation and IUGR, now AEDF UAD but currently clinically stable  CHTN exacerbation now stable -BP's stable, Continue current regimen of Procardia 30XL AM, 60XL PM and Labetalol 600 q 8hr. patient intermittently higher blood pressures but also times with lower blood pressures.  Do not recommend increasing her antihypertensives at this time. -Last [place] labs 1/23, creatinine a bit higher but patient reports not drinking much prior to that lab draw.  Repeat creatinine today 0.89.  Urine P/C 0.24  -Continue daily ASA 81mg  PEC ppx IUGR with AEDF- UAD Abnormality -Last growth [place] 1/12 EFW 4.5% - appreciate MFM recs as follows: repeat growth [place] in 2 weeks and UAD twice weekly.  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

## 2021-09-12 ENCOUNTER — Inpatient Hospital Stay (HOSPITAL_BASED_OUTPATIENT_CLINIC_OR_DEPARTMENT_OTHER): Payer: BC Managed Care – PPO

## 2021-09-12 DIAGNOSIS — O365920 Maternal care for other known or suspected poor fetal growth, second trimester, not applicable or unspecified: Secondary | ICD-10-CM

## 2021-09-12 DIAGNOSIS — Z3A25 25 weeks gestation of pregnancy: Secondary | ICD-10-CM

## 2021-09-12 MED ORDER — VALACYCLOVIR HCL 500 MG PO TABS
500.0000 mg | ORAL_TABLET | Freq: Two times a day (BID) | ORAL | Status: DC
  Administered 2021-09-12 – 2021-09-20 (×17): 500 mg via ORAL
  Filled 2021-09-12 (×17): qty 1

## 2021-09-12 NOTE — Progress Notes (Signed)
Brianna Lucero at 25w 5d HD#15 admitted with cHTN exacerbation and IUGR  No c/o; no ctx, vb, lof; +FM  Patient Vitals for the past 24 hrs:  BP Temp Temp src Pulse Resp SpO2 Weight  09/12/21 0836 (!) 142/80 -- -- 77 -- -- --  09/12/21 0736 (!) 108/57 98.7 F (37.1 C) Oral 80 18 95 % --  09/12/21 0558 -- -- -- 84 -- -- --  09/12/21 0507 (!) 121/57 98.4 F (36.9 C) Oral 75 -- 96 % 106.3 kg  09/11/21 2310 127/66 97.8 F (36.6 C) Oral 75 17 97 % --  09/11/21 2230 (!) 148/84 -- -- 74 -- -- --  09/11/21 2205 (!) 160/80 98.7 F (37.1 C) Oral 76 20 100 % --  09/11/21 1930 139/78 98.2 F (36.8 C) -- 81 16 99 % --  09/11/21 1550 125/74 98.5 F (36.9 C) Oral 76 18 100 % --  09/11/21 1139 (!) 141/78 98 F (36.7 C) Oral 80 18 99 % --   A&ox3 Nml respirations Abd: soft,nt,nd, gravid LE: +1 edema, nt bilat  CBC Latest Ref Rng & Units 09/11/2021 09/09/2021 09/02/2021  WBC 4.0 - 10.5 K/uL 7.0 8.4 10.0  Hemoglobin 12.0 - 15.0 g/dL 12.0 12.4 12.0  Hematocrit 36.0 - 46.0 % 36.1 38.6 36.2  Platelets 150 - 400 K/uL 186 211 203   CMP Latest Ref Rng & Units 09/11/2021 09/09/2021 09/02/2021  Glucose 70 - 99 mg/dL 86 85 92  BUN 6 - 20 mg/dL 9 11 10   Creatinine 0.44 - 1.00 mg/dL 0.89 1.05(H) 0.85  Sodium 135 - 145 mmol/L 132(L) 137 136  Potassium 3.5 - 5.1 mmol/L 4.2 4.1 3.7  Chloride 98 - 111 mmol/L 105 105 106  CO2 22 - 32 mmol/L 24 25 24   Calcium 8.9 - 10.3 mg/dL 8.9 9.1 8.9  Total Protein 6.5 - 8.1 g/dL 5.6(L) 6.0(L) 5.6(L)  Total Bilirubin 0.3 - 1.2 mg/dL 0.9 0.3 0.5  Alkaline Phos 38 - 126 U/L 60 66 54  AST 15 - 41 U/L 27 22 23   ALT 0 - 44 U/L 18 18 26    NST: FHT: 130s, nml variability, +10x10 accels, no decels, variables (short,brief) TOCO: no ctx  2. FHT: 120s, nml variability, +10x10 accels, variables (short,brief), no decels TOCO:no ctx  3: fht: 0945: 120, minimal variability initially with quick varibles, frequent; then variables back to back to 90 with another variable to 100s and closer  to 30sec, these resolved and then variability back to nml, now with occasional quick/short variable Toco:no ctx  U/s 02/27/44: cephalic, anterior placenta, fht 130, afi largest pocket 4.5cm uad: adfv, no rdfv   A/p:  CHTN exacerbation now stable -BP's stable, labile presures; Continue current regimen of Procardia 30XL AM, 60XL PM and Labetalol 600 q 8hr. patient intermittently higher blood pressures but also times with lower blood pressures.  Do not recommend increasing her antihypertensives at this time - contin to follow closely. -Last [place] labs 1/26 with improved creatinine; contin wkly labs -Continue daily ASA 81mg  PEC ppx IUGR with AEDF- UAD Abnormality -Last growth [place] 1/12 EFW 4.5% -  repeat growth [place] in 1 week (q3wk) and UAD twice weekly.  -Next UAD Mon (Mon, Thurs)  -S/p BMZ 1/12-1/13 -Extended fetal monitoring 1hr q shift- will continue with prolonged monitoring but since deeper variables resolved, anticipate return to 1 hr monitoring q shift -s/p NICU consult IVF pregnancy (donor egg) Anxiety no meds Antepartum care -Regular diet and bowel regimen PRN -Daily PNV -Hx of  HSV no active lesions, requests to take BID Acyclovir - discussed recommendations for suppression -acyclovir tid vs valtrex bid - pt will change to bid valtrex -Continue inpatient management due to abn Dopplers

## 2021-09-13 NOTE — Progress Notes (Signed)
Tracing Note  Received call from RN to review FHR tracing. Patient having 1 hour of monitoring every shift for IUGR with AEDV on UAD. This evening tracing reviewed starting at 2232: baseline 130 bpm with mostly moderate variability, but short periods of minimal variability noted, +10x10 accelerations, intermittent small variable decelerations that promptly return to baseline. There is one prolonged 2 minute decel with nadir in 60s for 10 sec, spontaneous return to baseline, and no additional prolonged decels since then. Patient had [place] with MFM todau and showed stable AEDV for UAD, no evidence of reverse flow. Patient did have severe range BP earlier this evening but came down to her baseline without any IV pushes. Patient ok to come off EFM at this time and continue with current plan of care. Discussed with RN  Dashanae Longfield A Shahira Fiske 09/13/21 12:28 AM

## 2021-09-13 NOTE — Progress Notes (Signed)
Brianna Lucero 46 y.o. G3P0020 at [redacted]w[redacted]d HD#16 admitted with cHTN exacerbation and IUGR now with AEDV abnormal UAD  S: Patient doing ok this morning. Does admit to some increased anxiety yesterday with having prolonged fetal monitoring. Reports feeling good FM. No CTXs, VB, or LOF. No HA, vision changes, CP, SOB. No RUQ pain or N/V. Minimal swelling in LE unchanged since admission.  O: Vitals:   09/13/21 0010 09/13/21 0404 09/13/21 0620 09/13/21 0818  BP: (!) 144/89 137/65 (!) 153/90 135/86  Pulse: 73 80 79 78  Resp:  20  18  Temp:  98.8 F (37.1 C)  98.5 F (36.9 C)  TempSrc:  Oral  Oral  SpO2:    98%  Weight:  106.6 kg    Height:       CBC Latest Ref Rng & Units 09/11/2021 09/09/2021 09/02/2021  WBC 4.0 - 10.5 K/uL 7.0 8.4 10.0  Hemoglobin 12.0 - 15.0 g/dL 12.0 12.4 12.0  Hematocrit 36.0 - 46.0 % 36.1 38.6 36.2  Platelets 150 - 400 K/uL 186 211 203   CMP Latest Ref Rng & Units 09/11/2021 09/09/2021 09/02/2021  Glucose 70 - 99 mg/dL 86 85 92  BUN 6 - 20 mg/dL 9 11 10   Creatinine 0.44 - 1.00 mg/dL 0.89 1.05(H) 0.85  Sodium 135 - 145 mmol/L 132(L) 137 136  Potassium 3.5 - 5.1 mmol/L 4.2 4.1 3.7  Chloride 98 - 111 mmol/L 105 105 106  CO2 22 - 32 mmol/L 24 25 24   Calcium 8.9 - 10.3 mg/dL 8.9 9.1 8.9  Total Protein 6.5 - 8.1 g/dL 5.6(L) 6.0(L) 5.6(L)  Total Bilirubin 0.3 - 1.2 mg/dL 0.9 0.3 0.5  Alkaline Phos 38 - 126 U/L 60 66 54  AST 15 - 41 U/L 27 22 23   ALT 0 - 44 U/L 18 18 26    Physical Exam: -General: AAO, NAD -Heart: RRR -Lungs: CTABL no audible wheezes, rales, or crackles -Abdomen: gravid uterus, non-tender to palpation -Extremities: Neg LE edema  Fetal Monitoring:  1/26 PM tracing see separate note 1/27 AM tracing reviewed 0935: baseline 130 bpm moderate variability +10x10 accels, rare small variable decel during the 1hr 10 minute of tracing performed  US:  4/76: cephalic, normal fluid, UAD shows persistent absent end diastolic flow, no reverse  flow  A/P: Brianna Lucero 46 y.o. G3P0020 at [redacted]w[redacted]d HD#16 admitted with cHTN exacerbation and IUGR, now with abnormal UAD showing AEDV, currently clinically stable without signs of fetal distress    cHTN exacerbation now stable -BP's reviewed over past 24hrs: patient had a severe range yesterday afternoon at 1330 and again in the evening 1930- both just prior to oral antihypertensive medication admin. Patient feels spike in BP yesterday provoked by concerns regarding fetal tracing. BP's are labile, with occasional hypotensive readings- mostly in early AM while patient laying/sleeping. Will hold on adjusting regimen for now to avoid placental hypoperfusion. Continue current regimen of Procardia 30AM, 60PM and Labetalol 600 q 8hr. But would consider increasing AM Procardia dosing if breakthrough severe ranges persistent -Last PIH labs done earlier this week 1/25 WNL -Continue daily ASA 81mg  PEC ppx IUGR with AEDV UAD Abnormality -Last growth [place] 1/12 EFW 4.5% - appreciate MFM recs as follows: repeat growth [place] in 2 weeks and UAD twice weekly.  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

## 2021-09-14 LAB — TYPE AND SCREEN
ABO/RH(D): O POS
Antibody Screen: NEGATIVE

## 2021-09-14 NOTE — Progress Notes (Signed)
Brianna Lucero 46 y.o. G1P0 at [redacted]w[redacted]d HD#17 admitted with cHTN exacerbation and IUGR  S: Patient doing well today. Denies HA,  CP, SOB, RUQ/epigastric pain. Tolerating regular diet without N/V. Endorses good FM. Denies any CTXs, VB, or LOF. Patient still anxious for discharge home. Had discussion with MFM.  O: Vitals:   09/13/21 1555 09/13/21 2000 09/13/21 2346 09/14/21 0445  BP: (!) 145/84 (!) 152/88 139/68 126/63  Pulse: 78 78 76 77  Resp: 18 19 18 18   Temp: 98.5 F (36.9 C) 97.6 F (36.4 C) 97.6 F (36.4 C) (!) 97.5 F (36.4 C)  TempSrc: Oral Oral Oral Oral  SpO2: 99% 99% 99% 99%  Weight:    106.6 kg  Lucero:      CBC    Component Value Date/Time   WBC 7.0 09/11/2021 1125   RBC 4.22 09/11/2021 1125   HGB 12.0 09/11/2021 1125   HCT 36.1 09/11/2021 1125   PLT 186 09/11/2021 1125   MCV 85.5 09/11/2021 1125   MCH 28.4 09/11/2021 1125   MCHC 33.2 09/11/2021 1125   RDW 14.2 09/11/2021 1125   LYMPHSABS 1.9 09/11/2021 1125   MONOABS 0.8 09/11/2021 1125   EOSABS 0.1 09/11/2021 1125   BASOSABS 0.0 09/11/2021 1125   CMP     Component Value Date/Time   NA 132 (L) 09/11/2021 1125   K 4.2 09/11/2021 1125   CL 105 09/11/2021 1125   CO2 24 09/11/2021 1125   GLUCOSE 86 09/11/2021 1125   BUN 9 09/11/2021 1125   CREATININE 0.89 09/11/2021 1125   CALCIUM 8.9 09/11/2021 1125   PROT 5.6 (L) 09/11/2021 1125   ALBUMIN 2.7 (L) 09/11/2021 1125   AST 27 09/11/2021 1125   ALT 18 09/11/2021 1125   ALKPHOS 60 09/11/2021 1125   BILITOT 0.9 09/11/2021 1125   GFRNONAA >60 09/11/2021 1125    physical Exam: -General: AAO, NAD -Heart: RRR -Lungs: CTABL no audible wheezes, rales, or crackles -Abdomen: gravid uterus, non-tender to palpation -Extremities: Neg LE edema -Neuro: non focal  -Skin, intact  Fetal Monitoring: -NST: reactive for gestational age, baseline 135 bpm mod variability +10x10 accels, no recurrent decelerations occ rare mild variable  -Toco: acontractile  Growth  [place] 1/12: breech EFW 4.5% with UAD WNL   1/19 - BPP 8/8 with nl AFI and AEDF on UAD no reversal  A/P: Brianna Lucero 46 y.o. G1P0 at [redacted]w[redacted]d HD#17 admitted with cHTN exacerbation and IUGR, now with improved BP and reassuring fetal status  cHTN exacerbation- severe range on admissin. -BP's reviewed over past 24hrs: primarily 140-150s/90s with intermittent diastolic in 622W. Will continue on PO Labetalol 600 mg q 8hr and Procardia 30XL in AM and 60XL in PM for now.  -Last PIH labs done on 1/25 WNL, Consider rpt PIH labs weekly -Continue daily ASA 81mg  PEC ppx IUGR -Last growth [place] 1/12 EFW 4.5% with normal UAD- appreciate MFM recs as follows: repeat growth [place] in 3 weeks and UAD twice weekly. F/u UAD AEDF but no reversal. 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    Brianna Lucero J 09/14/21 7:55 AM

## 2021-09-14 NOTE — Progress Notes (Signed)
Reviewed NST this AM FHR 120-130, BTBV 5-25, non repetitive mild variable noted. Occ Acce; 10x10 Category 1 and c/w EGA.

## 2021-09-14 NOTE — Progress Notes (Addendum)
Tracing Note  Received call from RN to review FHR tracing. Patient having 1 hour of monitoring every shift for IUGR with AEDV on UAD. This morning tracing reviewed starting at [phone]. The  baseline 120-130 bpm with mostly moderate variability, but short periods of minimal variability noted, +10x10 accelerations, intermittent small variable decelerations that promptly return to baseline. There is one prolonged 45-60 second decel with nadir in 90s, spontaneous return to baseline, and no additional prolonged decels since then. Patient had [place] with MFM yesterday and showed stable AEDV for UAD, no evidence of reverse flow. Pt has had no severe range BPs today. Patient ok to come off EFM at this time and continue with current plan of care. Discussed with RN  Lovenia Kim 09/14/21 10:41 AM

## 2021-09-15 NOTE — Progress Notes (Signed)
Brianna Lucero 46 y.o. G1P0 at [redacted]w[redacted]d HD#18 admitted with cHTN exacerbation and IUGR  S: Patient doing well today. Denies HA,  CP, SOB, RUQ/epigastric pain. Tolerating regular diet without N/V. Endorses good FM. Denies any CTXs, VB, or LOF.  O: Vitals:   09/14/21 2346 09/15/21 0420 09/15/21 0746 09/15/21 1138  BP: (!) 171/95 128/74 (!) 143/83 122/73  Pulse: 75 78 83 79  Resp: 17 18 18 18   Temp:  98.3 F (36.8 C) 98.5 F (36.9 C) 98.9 F (37.2 C)  TempSrc:  Oral Oral Oral  SpO2:  98% 99% 98%  Weight:  106.4 kg    Lucero:      CBC    Component Value Date/Time   WBC 7.0 09/11/2021 1125   RBC 4.22 09/11/2021 1125   HGB 12.0 09/11/2021 1125   HCT 36.1 09/11/2021 1125   PLT 186 09/11/2021 1125   MCV 85.5 09/11/2021 1125   MCH 28.4 09/11/2021 1125   MCHC 33.2 09/11/2021 1125   RDW 14.2 09/11/2021 1125   LYMPHSABS 1.9 09/11/2021 1125   MONOABS 0.8 09/11/2021 1125   EOSABS 0.1 09/11/2021 1125   BASOSABS 0.0 09/11/2021 1125   CMP     Component Value Date/Time   NA 132 (L) 09/11/2021 1125   K 4.2 09/11/2021 1125   CL 105 09/11/2021 1125   CO2 24 09/11/2021 1125   GLUCOSE 86 09/11/2021 1125   BUN 9 09/11/2021 1125   CREATININE 0.89 09/11/2021 1125   CALCIUM 8.9 09/11/2021 1125   PROT 5.6 (L) 09/11/2021 1125   ALBUMIN 2.7 (L) 09/11/2021 1125   AST 27 09/11/2021 1125   ALT 18 09/11/2021 1125   ALKPHOS 60 09/11/2021 1125   BILITOT 0.9 09/11/2021 1125   GFRNONAA >60 09/11/2021 1125    physical Exam: -General: AAO, NAD -Heart: RRR -Lungs: CTABL no audible wheezes, rales, or crackles -Abdomen: gravid uterus, non-tender to palpation -Extremities: Neg LE edema -Neuro: non focal  -Skin, intact  Fetal Monitoring: -NST: reactive for gestational age, baseline 135 bpm mod variability +10x10 accels, no recurrent decelerations occ rare mild variable  -Toco: acontractile  Growth [place] 1/12: breech EFW 4.5% with UAD WNL   1/19 - BPP 8/8 with nl AFI and AEDF on UAD no  reversal  A/P: Brianna Lucero 46 y.o. G1P0 at [redacted]w[redacted]d HD#18 admitted with cHTN exacerbation and IUGR, now with improved BP and reassuring fetal status  cHTN exacerbation- severe range on admissin. -BP's reviewed over past 24hrs: primarily 140-150s/90s with intermittent diastolic in 401U. Had severe range BPs with inc anxiety last night. Will continue on PO Labetalol 600 mg q 8hr and Procardia 30XL in AM and 60XL in PM for now.  -Last PIH labs done on 1/25 WNL, Consider rpt PIH labs weekly -Continue daily ASA 81mg  PEC ppx IUGR -Last growth [place] 1/12 EFW 4.5% with normal UAD- appreciate MFM recs as follows: repeat growth [place] in 3 weeks and UAD twice weekly. F/u UAD AEDF but no reversal. 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    Brianna Lucero J 09/15/21 12:21 PM

## 2021-09-15 NOTE — Progress Notes (Addendum)
Tracing Note  Received call from RN to review FHR tracing and fu BP elevation. Patient having 6min of monitoring bid for IUGR with AEDV on UAD. This morning tracing reviewed starting at initiation of monitoring. The  baseline 120-130 bpm with mostly moderate variability, but short periods of minimal variability noted, +10x10 accelerations, intermittent small variable decelerations that promptly return to baseline. Patient had [place] with MFM Friday and showed stable AEDV for UAD, no evidence of reverse flow. Pt has had two severe range BPs last night likely associated with inc anxiety due to monitoring. NO headache, CP or epigastric pain,Patient ok to come off EFM continue with current plan of care. Discussed with RN  Lovenia Kim 09/15/21 9:10 AM

## 2021-09-15 NOTE — Progress Notes (Signed)
Tracing Note  Received call from RN to review FHR tracing and fu BP elevation. Patient having 34min of monitoring bid for IUGR with AEDV on UAD. This morning tracing reviewed starting at initiation of monitoring on 1024The  baseline 120-130 bpm with mostly moderate variability, but short periods of minimal variability noted, +10x10 accelerations, intermittent small variable decelerations that promptly return to baseline. Patient had [place] with MFM Friday and showed stable AEDV for UAD, no evidence of reverse flow. Pt has had no severe range BPs this am. NO headache, CP or epigastric pain,Patient ok to come off EFM continue with current plan of care. Discussed with RN  Lovenia Kim 09/15/21 12:20 PM

## 2021-09-16 ENCOUNTER — Inpatient Hospital Stay (HOSPITAL_BASED_OUTPATIENT_CLINIC_OR_DEPARTMENT_OTHER): Payer: BC Managed Care – PPO

## 2021-09-16 DIAGNOSIS — O09812 Supervision of pregnancy resulting from assisted reproductive technology, second trimester: Secondary | ICD-10-CM

## 2021-09-16 DIAGNOSIS — O1012 Pre-existing hypertensive heart disease complicating childbirth: Secondary | ICD-10-CM | POA: Diagnosis not present

## 2021-09-16 DIAGNOSIS — Z3A26 26 weeks gestation of pregnancy: Secondary | ICD-10-CM

## 2021-09-16 DIAGNOSIS — O365920 Maternal care for other known or suspected poor fetal growth, second trimester, not applicable or unspecified: Secondary | ICD-10-CM | POA: Diagnosis not present

## 2021-09-16 DIAGNOSIS — O1492 Unspecified pre-eclampsia, second trimester: Secondary | ICD-10-CM | POA: Diagnosis not present

## 2021-09-16 LAB — CBC
HCT: 38.4 % (ref 36.0–46.0)
Hemoglobin: 12.8 g/dL (ref 12.0–15.0)
MCH: 28.5 pg (ref 26.0–34.0)
MCHC: 33.3 g/dL (ref 30.0–36.0)
MCV: 85.5 fL (ref 80.0–100.0)
Platelets: 196 10*3/uL (ref 150–400)
RBC: 4.49 MIL/uL (ref 3.87–5.11)
RDW: 14.3 % (ref 11.5–15.5)
WBC: 7.1 10*3/uL (ref 4.0–10.5)
nRBC: 0 % (ref 0.0–0.2)

## 2021-09-16 LAB — COMPREHENSIVE METABOLIC PANEL
ALT: 52 U/L — ABNORMAL HIGH (ref 0–44)
AST: 45 U/L — ABNORMAL HIGH (ref 15–41)
Albumin: 2.9 g/dL — ABNORMAL LOW (ref 3.5–5.0)
Alkaline Phosphatase: 66 U/L (ref 38–126)
Anion gap: 6 (ref 5–15)
BUN: 9 mg/dL (ref 6–20)
CO2: 25 mmol/L (ref 22–32)
Calcium: 9.3 mg/dL (ref 8.9–10.3)
Chloride: 105 mmol/L (ref 98–111)
Creatinine, Ser: 0.85 mg/dL (ref 0.44–1.00)
GFR, Estimated: >60 mL/min (ref >60)
Glucose, Bld: 74 mg/dL (ref 70–99)
Potassium: 4.3 mmol/L (ref 3.5–5.1)
Sodium: 136 mmol/L (ref 135–145)
Total Bilirubin: 0.4 mg/dL (ref 0.3–1.2)
Total Protein: 6 g/dL — ABNORMAL LOW (ref 6.5–8.1)

## 2021-09-16 MED ORDER — NIFEDIPINE ER OSMOTIC RELEASE 60 MG PO TB24: 60.0000 mg | ORAL_TABLET | Freq: Every day | ORAL | Status: DC

## 2021-09-16 MED ORDER — NIFEDIPINE ER OSMOTIC RELEASE 30 MG PO TB24
30.0000 mg | ORAL_TABLET | ORAL | Status: AC
  Administered 2021-09-16: 30 mg via ORAL
  Filled 2021-09-16: qty 1

## 2021-09-16 MED ORDER — NIFEDIPINE ER OSMOTIC RELEASE 60 MG PO TB24
60.0000 mg | ORAL_TABLET | Freq: Two times a day (BID) | ORAL | Status: DC
  Administered 2021-09-16 – 2021-09-23 (×14): 60 mg via ORAL
  Filled 2021-09-16 (×14): qty 1

## 2021-09-16 MED ORDER — FAMOTIDINE 20 MG PO TABS
20.0000 mg | ORAL_TABLET | Freq: Once | ORAL | Status: AC
  Administered 2021-09-16: 20 mg via ORAL
  Filled 2021-09-16: qty 1

## 2021-09-16 NOTE — Consult Note (Signed)
MFM Note  Brianna Lucero has been hospitalized due to exacerbation of chronic hypertension and an IUGR fetus.  She is currently treated with labetalol 600 mg 3 times a day and Procardia 90 mg daily.  She denies any signs or symptoms of severe preeclampsia.  An ultrasound exam performed today shows normal amniotic fluid.    The umbilical artery Doppler studies show mostly absent end-diastolic flow with intermittent reversed end-diastolic flow.  The patient had repeat [place] labs performed this afternoon that showed mildly elevated liver function tests with an AST level of 45 and ALT level of 52.  Her platelet counts and current serum creatinine level were within normal limits.  Due to the new finding of intermittent reversed end-diastolic flow noted on her umbilical artery Doppler studies, we will increase the frequency of her NSTs to 3 times a day.  We will also increase her Procardia XL dose to 60 mg twice a day in addition to labetalol 600 mg 3 times a day.  We will repeat her ultrasound again tomorrow for umbilical artery Doppler studies and to assess the fetal growth.  She should also have repeat [place] labs drawn again tomorrow morning.  Should her liver function tests continue to increase or should the umbilical artery Doppler studies continue to show reversed end-diastolic flow, she should receive a rescue course of steroids.  The patient understands that we will try to delay delivery for as long as possible.  However, should her liver function tests continue to increase or should the umbilical artery Doppler studies show persistent reversed end-diastolic flow, delivery may be necessary soon.  She should receive magnesium sulfate for fetal neuroprotection for at least 3 to 4 hours prior to delivery.    The patient understands that should delivery be indicated in the near future, that she will most likely require a classical cesarean delivery.  We will continue to follow her closely  with you.    The patient stated that all of her questions have been answered today.

## 2021-09-16 NOTE — Progress Notes (Addendum)
Tracing Note  Received call from RN to review FHR tracing and fu BP elevation. Patient having 16min of monitoring tid for IUGR with AEDV (intermittent REDF) on UAD. This evening tracing reviewed starting at initiation of monitoring on 1625. The  baseline 120-130 bpm with mostly moderate variability, but short periods of minimal variability noted, +10x10 accelerations, intermittent small variable decelerations that promptly return to baseline. Patient had [place] with MFM today and showed   AEDV for UAD, occ evidence of reverse flow. Pt has had one severe range(165) BPs this pm. NO headache, CP or epigastric pain,Patient ok to come off EFM continue with current plan of care. Discussed with RN  Lovenia Kim 09/16/21 8:01 PM

## 2021-09-16 NOTE — Progress Notes (Addendum)
Karalee Height 46 y.o. G1P0 at 26w 3d   HD#19 admitted with cHTN exacerbation and IUGR  S: Slight nausea and HA this morning, improved with Pepcid. No SOB/ CP/ RUQ pain. LE swelling is stable. Is getting worried about BPs getting worse again. Informs about severe range BPs over the weekend. FHT d/w pt again  +Active FMs. No UCs/ LOF/ VB  O: Vitals:   09/16/21 0731 09/16/21 0732 09/16/21 1139 09/16/21 1140  BP: (!) 148/87 (!) 148/87 (!) 145/85   Pulse: 77 72 74   Resp: 16  16   Temp: 98 F (36.7 C)  98.6 F (37 C)   TempSrc: Oral  Oral   SpO2: 99%  99% 99%  Weight:      Height:       CBC Latest Ref Rng & Units 09/11/2021 09/09/2021 09/02/2021  WBC 4.0 - 10.5 K/uL 7.0 8.4 10.0  Hemoglobin 12.0 - 15.0 g/dL 12.0 12.4 12.0  Hematocrit 36.0 - 46.0 % 36.1 38.6 36.2  Platelets 150 - 400 K/uL 186 211 203   CMP Latest Ref Rng & Units 09/11/2021 09/09/2021 09/02/2021  Glucose 70 - 99 mg/dL 86 85 92  BUN 6 - 20 mg/dL 9 11 10   Creatinine 0.44 - 1.00 mg/dL 0.89 1.05(H) 0.85  Sodium 135 - 145 mmol/L 132(L) 137 136  Potassium 3.5 - 5.1 mmol/L 4.2 4.1 3.7  Chloride 98 - 111 mmol/L 105 105 106  CO2 22 - 32 mmol/L 24 25 24   Calcium 8.9 - 10.3 mg/dL 8.9 9.1 8.9  Total Protein 6.5 - 8.1 g/dL 5.6(L) 6.0(L) 5.6(L)  Total Bilirubin 0.3 - 1.2 mg/dL 0.9 0.3 0.5  Alkaline Phos 38 - 126 U/L 60 66 54  AST 15 - 41 U/L 27 22 23   ALT 0 - 44 U/L 18 18 26      physical Exam: -General: AAO, NAD -Heart: RRR -Lungs: CTABL  -Abdomen: gravid uterus, no UCs -Extremities: Neg LE edema -Neuro: non focal  -Skin, intact  Fetal Monitoring: -NST: 8 pm last night - reactive for gestational age, baseline 135 bpm mod variability +10x10 accels, no recurrent decelerations occ rare mild variable  NST 10 am this morning -reactive for gestational age, baseline 135 bpm mod variability +10x10 accels, no recurrent decelerations occ rare mild variable  -Toco: acontractile  Growth [place] 1/12: breech EFW 4.5% with UAD WNL  UAD  today-- AEDF with intermittent REDF --> spoke with Dr Annamaria Boots, he will see patient.    A/P: Ahnika Hannibal 46 y.o. G1P0 at 26w 3d HD#19 admitted with cHTN exacerbation and IUGR  cHTN exacerbation- severe range on admissin. -BP's reviewed and d/w Dr Annamaria Boots. Increase Procardia to 60 XL BID and cont Labetalol 600 mg q 8hr   -Last PIH labs done on 1/25 WNL, repeat labs today since BPs worse and UAD worse  -Continue daily ASA 81mg  PEC ppx IUGR -Last growth [place] 1/12 EFW 4.5%  -BTMZ 1/12, 1/13 - UAD abnormal - now intermittent REDF. Spoke w/ Dr Annamaria Boots- repeat UAD and add BPP and growth tomorrow, change NST to q 8 hrs , repeat rescue BTMZ course tomorrow if repeat UAD tomorrow not better   IVF pregnancy (donor egg) Uterine fibroids 5.   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 Delivery by C/section  Time spent today 45 min due to worsening imaging and BPs and changes made and also consulting MDM  Elveria Royals 09/16/21 12:24 PM

## 2021-09-17 ENCOUNTER — Inpatient Hospital Stay (HOSPITAL_BASED_OUTPATIENT_CLINIC_OR_DEPARTMENT_OTHER): Payer: BC Managed Care – PPO

## 2021-09-17 ENCOUNTER — Inpatient Hospital Stay (HOSPITAL_COMMUNITY): Payer: BC Managed Care – PPO

## 2021-09-17 DIAGNOSIS — O09522 Supervision of elderly multigravida, second trimester: Secondary | ICD-10-CM | POA: Diagnosis not present

## 2021-09-17 DIAGNOSIS — O10012 Pre-existing essential hypertension complicating pregnancy, second trimester: Secondary | ICD-10-CM | POA: Diagnosis not present

## 2021-09-17 DIAGNOSIS — O10912 Unspecified pre-existing hypertension complicating pregnancy, second trimester: Secondary | ICD-10-CM

## 2021-09-17 DIAGNOSIS — Z3A26 26 weeks gestation of pregnancy: Secondary | ICD-10-CM

## 2021-09-17 DIAGNOSIS — O365920 Maternal care for other known or suspected poor fetal growth, second trimester, not applicable or unspecified: Secondary | ICD-10-CM

## 2021-09-17 DIAGNOSIS — O1492 Unspecified pre-eclampsia, second trimester: Secondary | ICD-10-CM | POA: Diagnosis not present

## 2021-09-17 LAB — COMPREHENSIVE METABOLIC PANEL
ALT: 55 U/L — ABNORMAL HIGH (ref 0–44)
AST: 50 U/L — ABNORMAL HIGH (ref 15–41)
Albumin: 2.8 g/dL — ABNORMAL LOW (ref 3.5–5.0)
Alkaline Phosphatase: 67 U/L (ref 38–126)
Anion gap: 7 (ref 5–15)
BUN: 8 mg/dL (ref 6–20)
CO2: 22 mmol/L (ref 22–32)
Calcium: 9.1 mg/dL (ref 8.9–10.3)
Chloride: 108 mmol/L (ref 98–111)
Creatinine, Ser: 0.9 mg/dL (ref 0.44–1.00)
GFR, Estimated: >60 mL/min (ref >60)
Glucose, Bld: 115 mg/dL — ABNORMAL HIGH (ref 70–99)
Potassium: 4 mmol/L (ref 3.5–5.1)
Sodium: 137 mmol/L (ref 135–145)
Total Bilirubin: 0.3 mg/dL (ref 0.3–1.2)
Total Protein: 5.6 g/dL — ABNORMAL LOW (ref 6.5–8.1)

## 2021-09-17 LAB — CBC
HCT: 38 % (ref 36.0–46.0)
Hemoglobin: 12.3 g/dL (ref 12.0–15.0)
MCH: 28 pg (ref 26.0–34.0)
MCHC: 32.4 g/dL (ref 30.0–36.0)
MCV: 86.4 fL (ref 80.0–100.0)
Platelets: 184 10*3/uL (ref 150–400)
RBC: 4.4 MIL/uL (ref 3.87–5.11)
RDW: 14.3 % (ref 11.5–15.5)
WBC: 6.7 10*3/uL (ref 4.0–10.5)
nRBC: 0 % (ref 0.0–0.2)

## 2021-09-17 MED ORDER — BETAMETHASONE SOD PHOS & ACET 6 (3-3) MG/ML IJ SUSP
12.0000 mg | INTRAMUSCULAR | Status: AC
  Administered 2021-09-17 – 2021-09-18 (×2): 12 mg via INTRAMUSCULAR
  Filled 2021-09-17: qty 5

## 2021-09-17 MED ORDER — FAMOTIDINE 20 MG PO TABS
20.0000 mg | ORAL_TABLET | Freq: Two times a day (BID) | ORAL | Status: DC | PRN
  Administered 2021-09-17: 20 mg via ORAL
  Filled 2021-09-17: qty 1

## 2021-09-17 NOTE — Progress Notes (Signed)
Phone call fu with MFM UAD with continued intermittent REDF.  AST and ALT stable but slight elevation persists today. Interval growth approx 7oz in 3w per preliminary reading. Recommends Rescue course BMZ, Rpt labs in am, Rpt UAD on 2/2. Mag neuroprotective course prior to csection Likely delivery in 48-72 hrs.

## 2021-09-17 NOTE — Consult Note (Signed)
MFM Note  Brianna Lucero has been hospitalized due to exacerbation of chronic hypertension and IUGR.  Intermittent reversed end-diastolic flow was noted on her umbilical artery Doppler studies yesterday.  Her AST and ALT levels were also mildly elevated yesterday.  On today's ultrasound exam, the overall EFW was 1 pound 9 ounces (704 g, 1st percentile for her gestational age).  The fetus grew only 7 ounces over the past 3 weeks.  There was normal amniotic fluid noted.  Fetal body movements and fetal breathing movements were noted throughout today's exam.  Doppler studies of the umbilical arteries performed today continues to show intermittent reversed end-diastolic flow (unchanged from yesterday).  The patient's NST shows 10 x 10 accelerations with an occasional mild variable deceleration.   Her AST and ALT levels were 50 and 55 today.  This is slightly increased as compared to her labs yesterday.  Her platelet count remains within normal limits.  Her serum creatinine level remains within normal limits.  As her umbilical artery Doppler studies continues to show intermittent reversed end-diastolic flow and due to her mildly elevated liver function tests, a rescue course (2 doses of betamethasone 24 hours apart) of antenatal corticosteroids should be given at this time.  Her [place] labs should be repeated again tomorrow.  Delivery is recommended once she completes her rescue steroid course should her liver function tests continue to increase.  Magnesium sulfate should be started for fetal neuroprotection and be given for at least 3 to 4 hours prior to delivery.  As she is not being delivered at this time, we can hold off on giving her magnesium until the decision has been made for delivery.  At her current gestational age, delaying delivery for even a few extra days may help improve the neonatal outcome.  She should continue NSTs 3 times a day.  We will repeat the umbilical artery Dopplers again  in 2 days.

## 2021-09-17 NOTE — Progress Notes (Signed)
Tracing Note  Received call from RN to review FHR tracing and fu BP elevation. Patient having 36min of monitoring tid for IUGR with AEDV (intermittent REDF) on UAD. Last night tracing reviewed starting at initiation of monitoring on 2232.The  baseline 120-130 bpm with mostly moderate variability, but short periods of minimal variability noted, +10x10 accelerations, intermittent small variable decelerations that promptly return to baseline. Patient had [place] with MFM yesterday and showed   AEDV for UAD, occ evidence of reverse flow. Pt has had one severe range(164) BPs last pm. NO headache, CP or epigastric pain,Patient ok to come off EFM continue with current plan of care. Discussed with RN  Lovenia Kim 09/17/21 6:33 AM

## 2021-09-17 NOTE — Progress Notes (Signed)
Brianna Lucero 46 y.o. G1P0 at 78d HD#20 admitted with cHTN exacerbation and IUGR  S: Patient doing well today. Had HA - dull this am (with nl BP) which has resolved. Slight nausea this am resolved with Pepcid. Denies CP, SOB, RUQ/epigastric pain. Tolerating regular diet without N/V. Endorses good FM. Denies any CTXs, VB, or LOF.  O: Vitals:   09/16/21 1952 09/16/21 2007 09/17/21 0000 09/17/21 0529  BP: (!) 165/84 (!) 164/87 139/70 130/61  Pulse: 77 74 82 76  Resp: 16   16  Temp: 98.5 F (36.9 C)   98.5 F (36.9 C)  TempSrc: Oral   Oral  SpO2: 100% 100%  98%  Weight:    107 kg  Lucero:      CBC    Component Value Date/Time   WBC 7.1 09/16/2021 1250   RBC 4.49 09/16/2021 1250   HGB 12.8 09/16/2021 1250   HCT 38.4 09/16/2021 1250   PLT 196 09/16/2021 1250   MCV 85.5 09/16/2021 1250   MCH 28.5 09/16/2021 1250   MCHC 33.3 09/16/2021 1250   RDW 14.3 09/16/2021 1250   LYMPHSABS 1.9 09/11/2021 1125   MONOABS 0.8 09/11/2021 1125   EOSABS 0.1 09/11/2021 1125   BASOSABS 0.0 09/11/2021 1125   CMP     Component Value Date/Time   NA 136 09/16/2021 1250   K 4.3 09/16/2021 1250   CL 105 09/16/2021 1250   CO2 25 09/16/2021 1250   GLUCOSE 74 09/16/2021 1250   BUN 9 09/16/2021 1250   CREATININE 0.85 09/16/2021 1250   CALCIUM 9.3 09/16/2021 1250   PROT 6.0 (L) 09/16/2021 1250   ALBUMIN 2.9 (L) 09/16/2021 1250   AST 45 (H) 09/16/2021 1250   ALT 52 (H) 09/16/2021 1250   ALKPHOS 66 09/16/2021 1250   BILITOT 0.4 09/16/2021 1250   GFRNONAA >60 09/16/2021 1250    physical Exam: -General: AAO, NAD -Heart: RRR -Lungs: CTABL no audible wheezes, rales, or crackles -Abdomen: gravid uterus, non-tender to palpation -Extremities: Neg LE edema -Neuro: non focal  -Skin, intact  Fetal Monitoring: -NST: reactive for gestational age, baseline 135 bpm mod variability +10x10 accels, no recurrent decelerations occ rare mild variable  -Toco: acontractile See separate notes  Growth  [place] 1/12: breech EFW 4.5% with UAD WNL   1/30 - BPP 8/8 with nl AFI and AEDF on UAD occ reversal  A/P: Brianna Lucero 46 y.o. G1P0 at [redacted]w[redacted]d HD#20 admitted with cHTN exacerbation and IUGR, now with improved BP and reassuring fetal status  cHTN exacerbation- severe range on admission. -BP's reviewed over past 24hrs: primarily 140-150s/90s with intermittent diastolic in 458K. Had severe range BPs with change in dosing interval last night. Will continue on PO Labetalol 600 mg q 8hr and Procardia 60XL bid.  -Last PIH labs done on 1/30 showed slight inc in AST and ALT Will rpt [place] labs today per MFM recommendation -Continue daily ASA 81mg  PEC ppx IUGR -Last growth [place] 1/12 EFW 4.5% with normal UAD- appreciate MFM recs as follows: repeat growth [place]  and UAD today .  -S/p BMZ 1/12-1/13. Pending UAD today , may need rescue steroids. Mag neuroprotection pending decision to deliver. 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  4min - total time including nursing review, MFM discussion, direct pt care and chart / tracing  review   Brianna Lucero 09/17/21 8:57 AM

## 2021-09-18 LAB — CBC
HCT: 36.7 % (ref 36.0–46.0)
Hemoglobin: 12.5 g/dL (ref 12.0–15.0)
MCH: 28.9 pg (ref 26.0–34.0)
MCHC: 34.1 g/dL (ref 30.0–36.0)
MCV: 84.8 fL (ref 80.0–100.0)
Platelets: 188 10*3/uL (ref 150–400)
RBC: 4.33 MIL/uL (ref 3.87–5.11)
RDW: 13.9 % (ref 11.5–15.5)
WBC: 13.7 10*3/uL — ABNORMAL HIGH (ref 4.0–10.5)
nRBC: 0 % (ref 0.0–0.2)

## 2021-09-18 LAB — COMPREHENSIVE METABOLIC PANEL
ALT: 53 U/L — ABNORMAL HIGH (ref 0–44)
AST: 43 U/L — ABNORMAL HIGH (ref 15–41)
Albumin: 2.7 g/dL — ABNORMAL LOW (ref 3.5–5.0)
Alkaline Phosphatase: 74 U/L (ref 38–126)
Anion gap: 10 (ref 5–15)
BUN: 10 mg/dL (ref 6–20)
CO2: 20 mmol/L — ABNORMAL LOW (ref 22–32)
Calcium: 9.3 mg/dL (ref 8.9–10.3)
Chloride: 105 mmol/L (ref 98–111)
Creatinine, Ser: 0.83 mg/dL (ref 0.44–1.00)
GFR, Estimated: >60 mL/min (ref >60)
Glucose, Bld: 111 mg/dL — ABNORMAL HIGH (ref 70–99)
Potassium: 4.2 mmol/L (ref 3.5–5.1)
Sodium: 135 mmol/L (ref 135–145)
Total Bilirubin: 0.2 mg/dL — ABNORMAL LOW (ref 0.3–1.2)
Total Protein: 5.5 g/dL — ABNORMAL LOW (ref 6.5–8.1)

## 2021-09-18 NOTE — Progress Notes (Signed)
46 y.o. G1P0 at 7d HD#21 admitted with cHTN exacerbation and IUGR  Pt w/  Patient Vitals for the past 24 hrs:  BP Temp Temp src Pulse Resp SpO2 Weight  09/18/21 0506 -- -- -- -- -- -- 107.5 kg  09/18/21 0505 (!) 142/81 98.9 F (37.2 C) Oral 82 16 97 % --  09/17/21 2336 (!) 147/65 -- -- 77 -- -- --  09/17/21 2317 (!) 171/91 98.9 F (37.2 C) Oral 75 16 99 % --  09/17/21 2013 (!) 151/82 -- -- 79 -- -- --  09/17/21 1953 (!) 171/93 98.6 F (37 C) Oral 81 16 99 % --  09/17/21 1549 (!) 155/87 -- -- 80 -- -- --  09/17/21 1530 (!) 165/88 98.1 F (36.7 C) Oral 79 18 99 % --  09/17/21 1202 (!) 142/80 98.4 F (36.9 C) Oral 74 16 99 % --  09/17/21 1022 (!) 150/88 98.4 F (36.9 C) Oral 69 16 99 % --    Intake/Output Summary (Last 24 hours) at 09/18/2021 0929 Last data filed at 09/18/2021 0506 Gross per 24 hour  Intake --  Output 2125 ml  Net -2125 ml     A&ox3 Rrr Ctab Abd: soft,nt,nd, gravid LE: +2 edema, nt  CBC Latest Ref Rng & Units 09/18/2021 09/17/2021 09/16/2021  WBC 4.0 - 10.5 K/uL 13.7(H) 6.7 7.1  Hemoglobin 12.0 - 15.0 g/dL 12.5 12.3 12.8  Hematocrit 36.0 - 46.0 % 36.7 38.0 38.4  Platelets 150 - 400 K/uL 188 184 196   CMP Latest Ref Rng & Units 09/18/2021 09/17/2021 09/16/2021  Glucose 70 - 99 mg/dL 111(H) 115(H) 74  BUN 6 - 20 mg/dL 10 8 9   Creatinine 0.44 - 1.00 mg/dL 0.83 0.90 0.85  Sodium 135 - 145 mmol/L 135 137 136  Potassium 3.5 - 5.1 mmol/L 4.2 4.0 4.3  Chloride 98 - 111 mmol/L 105 108 105  CO2 22 - 32 mmol/L 20(L) 22 25  Calcium 8.9 - 10.3 mg/dL 9.3 9.1 9.3  Total Protein 6.5 - 8.1 g/dL 5.5(L) 5.6(L) 6.0(L)  Total Bilirubin 0.3 - 1.2 mg/dL 0.2(L) 0.3 0.4  Alkaline Phos 38 - 126 U/L 74 67 66  AST 15 - 41 U/L 43(H) 50(H) 45(H)  ALT 0 - 44 U/L 53(H) 55(H) 52(H)   NST:  2/1: 7654: 130s, nml variability, +10x10accels, no decels Toco: no ctx  1/31: 2220: 120s, nml varaibility, +15x15 accels, no decels Toco: no ctx  1/31: 1430: 130s, nml variability,  +10x10accels, occasional variabiles, 26min decel to 120s then back to baseline, no other decel Toco: no ctx  Sono 1/31: Efw 1'9" (707g, 1%), only 7oz growth over last 3 wk; nml afi; breech UAD: intermittent reversed end-diastolic flow   A/P: 46 y.o. G1P0 at [redacted]w[redacted]d HD#21 admitted with cHTN exacerbation and IUGR   cHTN exacerbation- severe range on admission. -BP's reviewed over past 24hrs: 140-150s/80-90s. Had severe range BPs x2 then mild range when repeated 64min later.  I was not notified about these values, a message was sent through chart system. Will continue on PO Labetalol 600 mg q 8hr and Procardia 60XL bid.  -Last PIH labs this am with slightly decreasing AST and ALT - now 43,53 - about same as 1/30 results; plan repeat tomorrow to closely follow; pt with worsening status overall and preparing for worsening requiring delivery; I have reviewed with pt, see also MFM note -Continue daily ASA 81mg   IUGR -IUGR with efw 1% yesterday, only 7oz growth in 3 wk ; intermittent reversed end  diastolic flow, repeat UAD in 1 day -S/p BMZ 1/12-1/13. Now getting rescue course and 2/2 this afternoon at 2:15p.  Mag neuroprotection for 3-4 hr when decision to deliver.  Delivery by c/s and consent in chart.  Breech last u/s. Patient understands classical c/s likely and procedure reviewed with her. 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 19min in communication with nursing, chart review, exam, counseling, charting

## 2021-09-18 NOTE — Progress Notes (Signed)
CTSP for bp  Pt notes not feeling anxious, no vision change, no SOB or CP. Pt notes very light HA, as she gets every night this time. Good FM, no LOF, no VB, no RUQ pain.    PE: Vitals:   09/18/21 1111 09/18/21 1454 09/18/21 1636 09/18/21 1924  BP: (!) 155/82 (!) 148/80 (!) 154/91 (!) 168/89  Pulse: 83 85 85 84  Resp: 18 18  18   Temp: 98.5 F (36.9 C) 97.9 F (36.6 C)  98.6 F (37 C)  TempSrc: Oral Oral  Oral  SpO2: 100% 100%  98%  Weight:      Height:       168/89, repeat 168/79  Gen: relaxed, no distress CV RRR Pulm CTAB, no crackles Abd: gravid, NT, no RUQ pain LE: 2+ edema b/l, 3+ DTR on left, 2+ DTR on right, no clonus  Toco: none FH: 125s, + accels, no decels, 10 beat var, reactive NST  A/P: Chronic htn with superimposed severe PEC remote from term with IUGR and intermittent reversal EDF. - Elevated bp, no acute hypertensive sx, pt with h/o bump in bp from 8-11 at night for the past several nights with resolution over night. Will cont to monitor closely given overall stability in bp and early GA. Monitoring closely her PEC with repeat labs pending tomorrow. Pt did 2nd dose of rescue course of BMZ this afternoon. With persistent high bps, worsening of labs we are planning PCS after 4 hrs Mag Sulfate.  - IUGR, due to placental insufficiency repeat dopplers in am  Ala Dach 09/18/2021 8:48 PM

## 2021-09-19 ENCOUNTER — Inpatient Hospital Stay (HOSPITAL_BASED_OUTPATIENT_CLINIC_OR_DEPARTMENT_OTHER): Payer: BC Managed Care – PPO

## 2021-09-19 ENCOUNTER — Inpatient Hospital Stay: Payer: BC Managed Care – PPO

## 2021-09-19 DIAGNOSIS — O09812 Supervision of pregnancy resulting from assisted reproductive technology, second trimester: Secondary | ICD-10-CM

## 2021-09-19 DIAGNOSIS — O09522 Supervision of elderly multigravida, second trimester: Secondary | ICD-10-CM

## 2021-09-19 DIAGNOSIS — O365930 Maternal care for other known or suspected poor fetal growth, third trimester, not applicable or unspecified: Secondary | ICD-10-CM

## 2021-09-19 DIAGNOSIS — O10012 Pre-existing essential hypertension complicating pregnancy, second trimester: Secondary | ICD-10-CM | POA: Diagnosis not present

## 2021-09-19 DIAGNOSIS — O365920 Maternal care for other known or suspected poor fetal growth, second trimester, not applicable or unspecified: Secondary | ICD-10-CM | POA: Diagnosis not present

## 2021-09-19 DIAGNOSIS — Z3A26 26 weeks gestation of pregnancy: Secondary | ICD-10-CM

## 2021-09-19 DIAGNOSIS — O99212 Obesity complicating pregnancy, second trimester: Secondary | ICD-10-CM

## 2021-09-19 LAB — COMPREHENSIVE METABOLIC PANEL
ALT: 53 U/L — ABNORMAL HIGH (ref 0–44)
AST: 40 U/L (ref 15–41)
Albumin: 2.7 g/dL — ABNORMAL LOW (ref 3.5–5.0)
Alkaline Phosphatase: 65 U/L (ref 38–126)
Anion gap: 11 (ref 5–15)
BUN: 12 mg/dL (ref 6–20)
CO2: 21 mmol/L — ABNORMAL LOW (ref 22–32)
Calcium: 9.2 mg/dL (ref 8.9–10.3)
Chloride: 103 mmol/L (ref 98–111)
Creatinine, Ser: 0.89 mg/dL (ref 0.44–1.00)
GFR, Estimated: >60 mL/min (ref >60)
Glucose, Bld: 112 mg/dL — ABNORMAL HIGH (ref 70–99)
Potassium: 4.2 mmol/L (ref 3.5–5.1)
Sodium: 135 mmol/L (ref 135–145)
Total Bilirubin: 0.3 mg/dL (ref 0.3–1.2)
Total Protein: 5.5 g/dL — ABNORMAL LOW (ref 6.5–8.1)

## 2021-09-19 LAB — CBC
HCT: 35.7 % — ABNORMAL LOW (ref 36.0–46.0)
Hemoglobin: 11.7 g/dL — ABNORMAL LOW (ref 12.0–15.0)
MCH: 28 pg (ref 26.0–34.0)
MCHC: 32.8 g/dL (ref 30.0–36.0)
MCV: 85.4 fL (ref 80.0–100.0)
Platelets: 192 10*3/uL (ref 150–400)
RBC: 4.18 MIL/uL (ref 3.87–5.11)
RDW: 14.2 % (ref 11.5–15.5)
WBC: 14.6 10*3/uL — ABNORMAL HIGH (ref 4.0–10.5)
nRBC: 0 % (ref 0.0–0.2)

## 2021-09-19 NOTE — Progress Notes (Signed)
46 y.o. G1P0 at 46'5d HD#22 admitted with cHTN exacerbation and progression to severe PEC and IUGR with absent and now reversal of end-diastolic flow.  Pt notes feeling well this morning.  No headache currently.  Good fetal movement, no leakage of fluid, no contractions, no vaginal bleeding.  Patient denies chest pain and shortness of breath.  Patient notes no right upper quadrant pain.  Patient reports no change in vision.  Patient does admit to increase in lower extremity edema.  Patient states her mild headache last night resolved on its own and never progressed into a full headache.  Patient Vitals for the past 24 hrs:  BP Temp Temp src Pulse Resp SpO2 Weight  09/19/21 0725 139/70 98.6 F (37 C) Oral 88 18 -- --  09/19/21 0429 128/69 98.7 F (37.1 C) Oral 86 17 99 % 107.6 kg  09/18/21 2318 (!) 141/64 97.9 F (36.6 C) Oral 83 17 97 % --  09/18/21 2044 (!) 169/78 -- -- 84 -- -- --  09/18/21 1954 (!) 168/90 -- -- 82 -- -- --  09/18/21 1924 (!) 168/89 98.6 F (37 C) Oral 84 18 98 % --  09/18/21 1636 (!) 154/91 -- -- 85 -- -- --  09/18/21 1454 (!) 148/80 97.9 F (36.6 C) Oral 85 18 100 % --  09/18/21 1111 (!) 155/82 98.5 F (36.9 C) Oral 83 18 100 % --     Intake/Output Summary (Last 24 hours) at 09/19/2021 1104 Last data filed at 09/19/2021 6812 Gross per 24 hour  Intake 840 ml  Output 2050 ml  Net -1210 ml     Physical exam: A&ox3, appropriately concerned about possible delivery soon Abd: soft,nt,nd, gravid, no fundal tenderness, no right upper quadrant pain LE: 3+ edema with pitting bilaterally.  2+ DTR bilaterally.  1 beat of clonus on right side.  CBC Latest Ref Rng & Units 09/19/2021 09/18/2021 09/17/2021  WBC 4.0 - 10.5 K/uL 14.6(H) 13.7(H) 6.7  Hemoglobin 12.0 - 15.0 g/dL 11.7(L) 12.5 12.3  Hematocrit 36.0 - 46.0 % 35.7(L) 36.7 38.0  Platelets 150 - 400 K/uL 192 188 184   CMP Latest Ref Rng & Units 09/19/2021 09/18/2021 09/17/2021  Glucose 70 - 99 mg/dL 112(H) 111(H) 115(H)   BUN 6 - 20 mg/dL 12 10 8   Creatinine 0.44 - 1.00 mg/dL 0.89 0.83 0.90  Sodium 135 - 145 mmol/L 135 135 137  Potassium 3.5 - 5.1 mmol/L 4.2 4.2 4.0  Chloride 98 - 111 mmol/L 103 105 108  CO2 22 - 32 mmol/L 21(L) 20(L) 22  Calcium 8.9 - 10.3 mg/dL 9.2 9.3 9.1  Total Protein 6.5 - 8.1 g/dL 5.5(L) 5.5(L) 5.6(L)  Total Bilirubin 0.3 - 1.2 mg/dL 0.3 0.2(L) 0.3  Alkaline Phos 38 - 126 U/L 65 74 67  AST 15 - 41 U/L 40 43(H) 50(H)  ALT 0 - 44 U/L 53(H) 53(H) 55(H)   NST:  2/2 10am: 135's, pause accelerations, 10 x 10 accelerations, 10 beat variability, no decelerations.  Appropriate for gestational age.  Toco: No contractions  Sono 1/31: Efw 1'9" (707g, 1%), only 7oz growth over last 3 wk; nml afi; breech UAD: intermittent reversed end-diastolic flow  Dopplers 2/2: Final report pending.  Prelim report notes absent and reversed end-diastolic flow   A/P: 46 y.o. G1P0 at [redacted]w[redacted]d HD#22 admitted with cHTN exacerbation which is now progressed to severe preeclampsia and also with IUGR with suboptimal fetal growth and reversal of end-diastolic flow   cHTN exacerbation- severe range on admission. -BP's  tend to trend into the severe range from 7 PM to 11 PM over the past several nights.  She has not been given any additional antihypertensive due to concern over placental perfusion pressure.  These temporary increases resolve over a few hours and patient is usually without significant symptoms other than mild headache.  This happened again last night but her exam was stable.  Only mild range blood pressures overnight and this morning.  Will continue on PO Labetalol 600 mg q 8hr and Procardia 60XL bid.   -Increase liver function tests consistent with preeclampsia.  No further increases this morning though I suspect this is due to her betamethasone on January 31 and February 1.  We will repeat labs tomorrow.  Creatinine and platelets have been stable.  Patient now with increasing lower extremity edema and 1  beat of clonus on her exam today.  However she is without headache.  We will consult with MFM this a.m. once her Dopplers are read.  -Continue daily ASA 81mg    IUGR -IUGR with efw 1%, only 7oz growth in 3 wk ; intermittent reversed end diastolic flow yesterday,  repeat UAD this am.  -S/p BMZ 1/12-1/13. Now  s/p  1/31 and 2/2  at 2:15p.  Mag neuroprotection for 3-4 hr when decision to deliver.  Delivery by c/s and consent in chart. Vtx today but baby has been in variable lie Patient understands classical c/s likely and procedure reviewed with her.  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 69min in communication with nursing, MFM, chart review, exam, counseling, charting  Ala Dach 09/19/2021 11:13 AM

## 2021-09-19 NOTE — Consult Note (Signed)
MFM Brief Note  Ms. Winterhalter is HD #21 with known CHTN and severe FGR.   I was called by Dr. Pamala Hurry regarding the UA Dopplers and for management of nighttime cases of severe hypertension spikes.  Antenatal testing due to severe fetal growth restriction UA Dopplers are demonstrate persistent AEDF, she had one wave form consistent with REDF   Continue daily labs and repeat UA Dopplers in [place].  I discussed with Dr. Pamala Hurry the concern regarding the night time severe spike of Ms. Reich blood pressure. Per ACOG the recommnendations recommend cycling blood pressure every 15 minutes with treatment recommended within 30-60 minutes of diagnosis.  Dr. Pamala Hurry reports that Ms. Zawistowski is asymptoatic as a whole and her blood pressures return to normal after a few hours.  We discussed that given the severe fetal growth restriction we are also concerned about the possible reduced pulse pressure and its impact on the fetus. Therefore, we agreed to start with a low dose of IV labetalol such as 10 mg and continue the severe hypertension protocol. We will also initiate magnesium sulfate and move toward delivery when safe and staffed.  I spent 20 minutes with direct communication with Dr. Pamala Hurry. We agreed to this plan.  Vikki Ports, MD.

## 2021-09-20 ENCOUNTER — Inpatient Hospital Stay (HOSPITAL_COMMUNITY): Payer: BC Managed Care – PPO | Admitting: Anesthesiology

## 2021-09-20 ENCOUNTER — Encounter (HOSPITAL_COMMUNITY): Payer: Self-pay | Admitting: Obstetrics and Gynecology

## 2021-09-20 ENCOUNTER — Other Ambulatory Visit: Payer: Self-pay

## 2021-09-20 ENCOUNTER — Encounter (HOSPITAL_COMMUNITY)
Admission: AD | Disposition: A | Discharge status: Home / Self Care | Payer: Self-pay | Source: Home / Self Care | Attending: Obstetrics and Gynecology

## 2021-09-20 DIAGNOSIS — Z98891 History of uterine scar from previous surgery: Secondary | ICD-10-CM

## 2021-09-20 DIAGNOSIS — O1410 Severe pre-eclampsia, unspecified trimester: Secondary | ICD-10-CM | POA: Diagnosis not present

## 2021-09-20 DIAGNOSIS — O1415 Severe pre-eclampsia, complicating the puerperium: Secondary | ICD-10-CM | POA: Diagnosis present

## 2021-09-20 DIAGNOSIS — I10 Essential (primary) hypertension: Secondary | ICD-10-CM | POA: Diagnosis present

## 2021-09-20 LAB — COMPREHENSIVE METABOLIC PANEL
ALT: 39 U/L (ref 0–44)
ALT: 40 U/L (ref 0–44)
AST: 27 U/L (ref 15–41)
AST: 28 U/L (ref 15–41)
Albumin: 2.5 g/dL — ABNORMAL LOW (ref 3.5–5.0)
Albumin: 2.7 g/dL — ABNORMAL LOW (ref 3.5–5.0)
Alkaline Phosphatase: 59 U/L (ref 38–126)
Alkaline Phosphatase: 72 U/L (ref 38–126)
Anion gap: 6 (ref 5–15)
Anion gap: 7 (ref 5–15)
BUN: 14 mg/dL (ref 6–20)
BUN: 9 mg/dL (ref 6–20)
CO2: 23 mmol/L (ref 22–32)
CO2: 21 mmol/L — ABNORMAL LOW (ref 22–32)
Calcium: 8.8 mg/dL — ABNORMAL LOW (ref 8.9–10.3)
Calcium: 8.6 mg/dL — ABNORMAL LOW (ref 8.9–10.3)
Chloride: 105 mmol/L (ref 98–111)
Chloride: 105 mmol/L (ref 98–111)
Creatinine, Ser: 1.01 mg/dL — ABNORMAL HIGH (ref 0.44–1.00)
Creatinine, Ser: 0.88 mg/dL (ref 0.44–1.00)
GFR, Estimated: >60 mL/min (ref >60)
GFR, Estimated: >60 mL/min (ref >60)
Glucose, Bld: 95 mg/dL (ref 70–99)
Glucose, Bld: 84 mg/dL (ref 70–99)
Potassium: 4.1 mmol/L (ref 3.5–5.1)
Potassium: 3.9 mmol/L (ref 3.5–5.1)
Sodium: 134 mmol/L — ABNORMAL LOW (ref 135–145)
Sodium: 133 mmol/L — ABNORMAL LOW (ref 135–145)
Total Bilirubin: 0.3 mg/dL (ref 0.3–1.2)
Total Bilirubin: <0.1 mg/dL — ABNORMAL LOW (ref 0.3–1.2)
Total Protein: 5.1 g/dL — ABNORMAL LOW (ref 6.5–8.1)
Total Protein: 5.6 g/dL — ABNORMAL LOW (ref 6.5–8.1)

## 2021-09-20 LAB — CBC WITH DIFFERENTIAL/PLATELET
Abs Immature Granulocytes: 0.11 10*3/uL — ABNORMAL HIGH (ref 0.00–0.07)
Abs Immature Granulocytes: 0.12 10*3/uL — ABNORMAL HIGH (ref 0.00–0.07)
Basophils Absolute: 0 10*3/uL (ref 0.0–0.1)
Basophils Absolute: 0 10*3/uL (ref 0.0–0.1)
Basophils Relative: 0 %
Basophils Relative: 0 %
Eosinophils Absolute: 0.1 10*3/uL (ref 0.0–0.5)
Eosinophils Absolute: 0 10*3/uL (ref 0.0–0.5)
Eosinophils Relative: 1 %
Eosinophils Relative: 0 %
HCT: 34.3 % — ABNORMAL LOW (ref 36.0–46.0)
HCT: 38.7 % (ref 36.0–46.0)
Hemoglobin: 11.2 g/dL — ABNORMAL LOW (ref 12.0–15.0)
Hemoglobin: 13.1 g/dL (ref 12.0–15.0)
Immature Granulocytes: 1 %
Immature Granulocytes: 1 %
Lymphocytes Relative: 29 %
Lymphocytes Relative: 29 %
Lymphs Abs: 3.2 10*3/uL (ref 0.7–4.0)
Lymphs Abs: 2.8 10*3/uL (ref 0.7–4.0)
MCH: 28 pg (ref 26.0–34.0)
MCH: 28.6 pg (ref 26.0–34.0)
MCHC: 32.7 g/dL (ref 30.0–36.0)
MCHC: 33.9 g/dL (ref 30.0–36.0)
MCV: 85.8 fL (ref 80.0–100.0)
MCV: 84.5 fL (ref 80.0–100.0)
Monocytes Absolute: 1.1 10*3/uL — ABNORMAL HIGH (ref 0.1–1.0)
Monocytes Absolute: 0.8 10*3/uL (ref 0.1–1.0)
Monocytes Relative: 10 %
Monocytes Relative: 9 %
Neutro Abs: 6.6 10*3/uL (ref 1.7–7.7)
Neutro Abs: 5.8 10*3/uL (ref 1.7–7.7)
Neutrophils Relative %: 59 %
Neutrophils Relative %: 61 %
Platelets: 186 10*3/uL (ref 150–400)
Platelets: 221 10*3/uL (ref 150–400)
RBC: 4 MIL/uL (ref 3.87–5.11)
RBC: 4.58 MIL/uL (ref 3.87–5.11)
RDW: 14.3 % (ref 11.5–15.5)
RDW: 14.1 % (ref 11.5–15.5)
WBC: 11 10*3/uL — ABNORMAL HIGH (ref 4.0–10.5)
WBC: 9.7 10*3/uL (ref 4.0–10.5)
nRBC: 0.3 % — ABNORMAL HIGH (ref 0.0–0.2)
nRBC: 0.2 % (ref 0.0–0.2)

## 2021-09-20 LAB — TYPE AND SCREEN
ABO/RH(D): O POS
ABO/RH(D): O POS
Antibody Screen: NEGATIVE
Antibody Screen: NEGATIVE

## 2021-09-20 LAB — MAGNESIUM: Magnesium: 3.6 mg/dL — ABNORMAL HIGH (ref 1.7–2.4)

## 2021-09-20 LAB — RESP PANEL BY RT-PCR (FLU A&B, COVID) ARPGX2
Influenza A by PCR: NEGATIVE
Influenza B by PCR: NEGATIVE
SARS Coronavirus 2 by RT PCR: NEGATIVE

## 2021-09-20 SURGERY — Surgical Case
Anesthesia: Spinal | Wound class: Clean Contaminated

## 2021-09-20 MED ORDER — ACETAMINOPHEN 10 MG/ML IV SOLN: 1000.0000 mg | Freq: Once | INTRAVENOUS | Status: DC | PRN

## 2021-09-20 MED ORDER — MAGNESIUM SULFATE 40 GM/1000ML IV SOLN: 2.0000 g/h | INTRAVENOUS | Status: DC

## 2021-09-20 MED ORDER — MAGNESIUM SULFATE 40 GM/1000ML IV SOLN
1.0000 g/h | INTRAVENOUS | Status: DC
  Administered 2021-09-20: 1 g/h via INTRAVENOUS
  Filled 2021-09-20: qty 1000

## 2021-09-20 MED ORDER — KETOROLAC TROMETHAMINE 30 MG/ML IJ SOLN
INTRAMUSCULAR | Status: AC
  Filled 2021-09-20: qty 1

## 2021-09-20 MED ORDER — ONDANSETRON HCL 4 MG/2ML IJ SOLN
INTRAMUSCULAR | Status: DC | PRN
  Administered 2021-09-20: 4 mg via INTRAVENOUS

## 2021-09-20 MED ORDER — KETOROLAC TROMETHAMINE 30 MG/ML IJ SOLN
30.0000 mg | Freq: Four times a day (QID) | INTRAMUSCULAR | Status: DC | PRN
  Administered 2021-09-20: 30 mg via INTRAVENOUS

## 2021-09-20 MED ORDER — NALOXONE HCL 4 MG/10ML IJ SOLN
1.0000 ug/kg/h | INTRAVENOUS | Status: DC | PRN
  Filled 2021-09-20: qty 5

## 2021-09-20 MED ORDER — DIPHENHYDRAMINE HCL 25 MG PO CAPS: 25.0000 mg | ORAL_CAPSULE | ORAL | Status: DC | PRN

## 2021-09-20 MED ORDER — DIPHENHYDRAMINE HCL 50 MG/ML IJ SOLN: 12.5000 mg | INTRAMUSCULAR | Status: DC | PRN

## 2021-09-20 MED ORDER — SENNOSIDES-DOCUSATE SODIUM 8.6-50 MG PO TABS
2.0000 | ORAL_TABLET | Freq: Every day | ORAL | Status: DC
  Administered 2021-09-22 – 2021-09-23 (×2): 2 via ORAL
  Filled 2021-09-20 (×3): qty 2

## 2021-09-20 MED ORDER — NALOXONE HCL 0.4 MG/ML IJ SOLN: 0.4000 mg | INTRAMUSCULAR | Status: DC | PRN

## 2021-09-20 MED ORDER — PHENYLEPHRINE HCL-NACL 20-0.9 MG/250ML-% IV SOLN
INTRAVENOUS | Status: DC | PRN
  Administered 2021-09-20: 30 ug/min via INTRAVENOUS

## 2021-09-20 MED ORDER — ACETAMINOPHEN 500 MG PO TABS
1000.0000 mg | ORAL_TABLET | Freq: Four times a day (QID) | ORAL | Status: AC
  Administered 2021-09-20 – 2021-09-21 (×3): 1000 mg via ORAL
  Filled 2021-09-20 (×3): qty 2

## 2021-09-20 MED ORDER — MAGNESIUM SULFATE BOLUS VIA INFUSION
6.0000 g | Freq: Once | INTRAVENOUS | Status: DC
  Filled 2021-09-20: qty 1000

## 2021-09-20 MED ORDER — SIMETHICONE 80 MG PO CHEW
80.0000 mg | CHEWABLE_TABLET | ORAL | Status: DC | PRN
  Administered 2021-09-22: 80 mg via ORAL
  Filled 2021-09-20: qty 1

## 2021-09-20 MED ORDER — OXYTOCIN-SODIUM CHLORIDE 30-0.9 UT/500ML-% IV SOLN
INTRAVENOUS | Status: AC
  Filled 2021-09-20: qty 500

## 2021-09-20 MED ORDER — SODIUM CHLORIDE 0.9 % IR SOLN
Status: DC | PRN
  Administered 2021-09-20: 1000 mL

## 2021-09-20 MED ORDER — DIBUCAINE (PERIANAL) 1 % EX OINT: 1.0000 "application " | TOPICAL_OINTMENT | CUTANEOUS | Status: DC | PRN

## 2021-09-20 MED ORDER — AMISULPRIDE (ANTIEMETIC) 5 MG/2ML IV SOLN: 10.0000 mg | Freq: Once | INTRAVENOUS | Status: DC | PRN

## 2021-09-20 MED ORDER — PRENATAL MULTIVITAMIN CH
1.0000 | ORAL_TABLET | Freq: Every day | ORAL | Status: DC
  Administered 2021-09-21 – 2021-09-23 (×3): 1 via ORAL
  Filled 2021-09-20 (×3): qty 1

## 2021-09-20 MED ORDER — MAGNESIUM SULFATE 40 GM/1000ML IV SOLN
INTRAVENOUS | Status: AC
  Filled 2021-09-20: qty 1000

## 2021-09-20 MED ORDER — LACTATED RINGERS IV SOLN: INTRAVENOUS | Status: DC

## 2021-09-20 MED ORDER — DEXAMETHASONE SODIUM PHOSPHATE 4 MG/ML IJ SOLN
INTRAMUSCULAR | Status: DC | PRN
Start: 2021-09-20 — End: 2021-09-20
  Administered 2021-09-20: 4 mg via INTRAVENOUS

## 2021-09-20 MED ORDER — TRANEXAMIC ACID-NACL 1000-0.7 MG/100ML-% IV SOLN
INTRAVENOUS | Status: AC
  Filled 2021-09-20: qty 100

## 2021-09-20 MED ORDER — CEFAZOLIN SODIUM-DEXTROSE 2-3 GM-%(50ML) IV SOLR
INTRAVENOUS | Status: DC | PRN
  Administered 2021-09-20: 2 g via INTRAVENOUS

## 2021-09-20 MED ORDER — IBUPROFEN 600 MG PO TABS
600.0000 mg | ORAL_TABLET | Freq: Four times a day (QID) | ORAL | Status: DC
  Administered 2021-09-21 – 2021-09-23 (×9): 600 mg via ORAL
  Filled 2021-09-20 (×9): qty 1

## 2021-09-20 MED ORDER — MAGNESIUM SULFATE BOLUS VIA INFUSION: 6.0000 g | Freq: Once | INTRAVENOUS | Status: DC

## 2021-09-20 MED ORDER — OXYTOCIN-SODIUM CHLORIDE 30-0.9 UT/500ML-% IV SOLN: 2.5000 [IU]/h | INTRAVENOUS | Status: AC

## 2021-09-20 MED ORDER — WITCH HAZEL-GLYCERIN EX PADS: 1.0000 "application " | MEDICATED_PAD | CUTANEOUS | Status: DC | PRN

## 2021-09-20 MED ORDER — SODIUM CHLORIDE 0.9% FLUSH
3.0000 mL | INTRAVENOUS | Status: DC | PRN
  Administered 2021-09-22: 3 mL via INTRAVENOUS

## 2021-09-20 MED ORDER — DEXAMETHASONE SODIUM PHOSPHATE 10 MG/ML IJ SOLN
INTRAMUSCULAR | Status: AC
  Filled 2021-09-20: qty 1

## 2021-09-20 MED ORDER — ONDANSETRON HCL 4 MG/2ML IJ SOLN
INTRAMUSCULAR | Status: AC
  Filled 2021-09-20: qty 2

## 2021-09-20 MED ORDER — PROMETHAZINE HCL 25 MG/ML IJ SOLN: 6.2500 mg | INTRAMUSCULAR | Status: DC | PRN

## 2021-09-20 MED ORDER — BUPIVACAINE IN DEXTROSE 0.75-8.25 % IT SOLN
INTRATHECAL | Status: DC | PRN
  Administered 2021-09-20: 1.6 mL via INTRATHECAL

## 2021-09-20 MED ORDER — TRANEXAMIC ACID-NACL 1000-0.7 MG/100ML-% IV SOLN
INTRAVENOUS | Status: DC | PRN
  Administered 2021-09-20: 1000 mg via INTRAVENOUS

## 2021-09-20 MED ORDER — OXYTOCIN-SODIUM CHLORIDE 30-0.9 UT/500ML-% IV SOLN
INTRAVENOUS | Status: DC | PRN
  Administered 2021-09-20: 30 [IU] via INTRAVENOUS

## 2021-09-20 MED ORDER — MORPHINE SULFATE (PF) 0.5 MG/ML IJ SOLN
INTRAMUSCULAR | Status: DC | PRN
  Administered 2021-09-20: .15 mg via INTRATHECAL

## 2021-09-20 MED ORDER — SOD CITRATE-CITRIC ACID 500-334 MG/5ML PO SOLN
ORAL | Status: AC
  Administered 2021-09-20: 30 mL
  Filled 2021-09-20: qty 30

## 2021-09-20 MED ORDER — OXYCODONE HCL 5 MG PO TABS: 5.0000 mg | ORAL_TABLET | Freq: Once | ORAL | Status: DC | PRN

## 2021-09-20 MED ORDER — ONDANSETRON HCL 4 MG/2ML IJ SOLN
4.0000 mg | Freq: Three times a day (TID) | INTRAMUSCULAR | Status: DC | PRN
  Administered 2021-09-20: 4 mg via INTRAVENOUS
  Filled 2021-09-20: qty 2

## 2021-09-20 MED ORDER — FENTANYL CITRATE (PF) 100 MCG/2ML IJ SOLN: 25.0000 ug | INTRAMUSCULAR | Status: DC | PRN

## 2021-09-20 MED ORDER — MORPHINE SULFATE (PF) 0.5 MG/ML IJ SOLN
INTRAMUSCULAR | Status: AC
  Filled 2021-09-20: qty 10

## 2021-09-20 MED ORDER — DIPHENHYDRAMINE HCL 25 MG PO CAPS: 25.0000 mg | ORAL_CAPSULE | Freq: Four times a day (QID) | ORAL | Status: DC | PRN

## 2021-09-20 MED ORDER — STERILE WATER FOR IRRIGATION IR SOLN
Status: DC | PRN
  Administered 2021-09-20: 1000 mL

## 2021-09-20 MED ORDER — FENTANYL CITRATE (PF) 100 MCG/2ML IJ SOLN
INTRAMUSCULAR | Status: DC | PRN
  Administered 2021-09-20: 15 ug via INTRATHECAL

## 2021-09-20 MED ORDER — MENTHOL 3 MG MT LOZG: 1.0000 | LOZENGE | OROMUCOSAL | Status: DC | PRN

## 2021-09-20 MED ORDER — FENTANYL CITRATE (PF) 100 MCG/2ML IJ SOLN
INTRAMUSCULAR | Status: AC
  Filled 2021-09-20: qty 2

## 2021-09-20 MED ORDER — OXYCODONE HCL 5 MG/5ML PO SOLN: 5.0000 mg | Freq: Once | ORAL | Status: DC | PRN

## 2021-09-20 MED ORDER — COCONUT OIL OIL
1.0000 "application " | TOPICAL_OIL | Status: DC | PRN
  Administered 2021-09-20: 1 via TOPICAL

## 2021-09-20 MED ORDER — ZOLPIDEM TARTRATE 5 MG PO TABS: 5.0000 mg | ORAL_TABLET | Freq: Every evening | ORAL | Status: DC | PRN

## 2021-09-20 MED ORDER — ACETAMINOPHEN 325 MG PO TABS: 650.0000 mg | ORAL_TABLET | ORAL | Status: DC | PRN

## 2021-09-20 MED ORDER — MAGNESIUM SULFATE 40 GM/1000ML IV SOLN
2.0000 g/h | INTRAVENOUS | Status: DC
  Administered 2021-09-20: 2 g/h via INTRAVENOUS

## 2021-09-20 MED ORDER — TETANUS-DIPHTH-ACELL PERTUSSIS 5-2.5-18.5 LF-MCG/0.5 IM SUSY
0.5000 mL | PREFILLED_SYRINGE | Freq: Once | INTRAMUSCULAR | Status: AC
  Administered 2021-09-21: 0.5 mL via INTRAMUSCULAR
  Filled 2021-09-20: qty 0.5

## 2021-09-20 MED ORDER — MAGNESIUM SULFATE 40 GM/1000ML IV SOLN: 1.0000 g/h | INTRAVENOUS | Status: DC

## 2021-09-20 MED ORDER — PHENYLEPHRINE 40 MCG/ML (10ML) SYRINGE FOR IV PUSH (FOR BLOOD PRESSURE SUPPORT)
PREFILLED_SYRINGE | INTRAVENOUS | Status: AC
  Filled 2021-09-20: qty 10

## 2021-09-20 MED ORDER — KETOROLAC TROMETHAMINE 30 MG/ML IJ SOLN
30.0000 mg | Freq: Four times a day (QID) | INTRAMUSCULAR | Status: AC
  Administered 2021-09-20 – 2021-09-21 (×3): 30 mg via INTRAVENOUS
  Filled 2021-09-20 (×3): qty 1

## 2021-09-20 MED ORDER — OXYCODONE HCL 5 MG PO TABS
5.0000 mg | ORAL_TABLET | ORAL | Status: DC | PRN
  Administered 2021-09-21 – 2021-09-22 (×3): 5 mg via ORAL
  Filled 2021-09-20 (×3): qty 1

## 2021-09-20 MED ORDER — SIMETHICONE 80 MG PO CHEW
80.0000 mg | CHEWABLE_TABLET | Freq: Three times a day (TID) | ORAL | Status: DC
  Administered 2021-09-21 – 2021-09-23 (×9): 80 mg via ORAL
  Filled 2021-09-20 (×9): qty 1

## 2021-09-20 MED ORDER — MAGNESIUM SULFATE BOLUS VIA INFUSION
6.0000 g | Freq: Once | INTRAVENOUS | Status: AC
  Administered 2021-09-20: 6 g via INTRAVENOUS
  Filled 2021-09-20: qty 1000

## 2021-09-20 MED ORDER — MEPERIDINE HCL 25 MG/ML IJ SOLN: 6.2500 mg | INTRAMUSCULAR | Status: DC | PRN

## 2021-09-20 MED ORDER — KETOROLAC TROMETHAMINE 30 MG/ML IJ SOLN: 30.0000 mg | Freq: Four times a day (QID) | INTRAMUSCULAR | Status: DC | PRN

## 2021-09-20 SURGICAL SUPPLY — 36 items
BARRIER ADHS 3X4 INTERCEED (GAUZE/BANDAGES/DRESSINGS) ×1 IMPLANT
BENZOIN TINCTURE PRP APPL 2/3 (GAUZE/BANDAGES/DRESSINGS) ×1 IMPLANT
CHLORAPREP W/TINT 26ML (MISCELLANEOUS) ×2 IMPLANT
CLAMP CORD UMBIL (MISCELLANEOUS) IMPLANT
CLOTH BEACON ORANGE TIMEOUT ST (SAFETY) ×2 IMPLANT
DRSG OPSITE POSTOP 4X10 (GAUZE/BANDAGES/DRESSINGS) ×2 IMPLANT
ELECT REM PT RETURN 9FT ADLT (ELECTROSURGICAL) ×2
ELECTRODE REM PT RTRN 9FT ADLT (ELECTROSURGICAL) ×1 IMPLANT
EXTRACTOR VACUUM KIWI (MISCELLANEOUS) IMPLANT
EXTRACTOR VACUUM M CUP 4 TUBE (SUCTIONS) IMPLANT
GLOVE SURG ENC MOIS LTX SZ7 (GLOVE) ×2 IMPLANT
GLOVE SURG UNDER POLY LF SZ7 (GLOVE) ×2 IMPLANT
GOWN STRL REUS W/TWL LRG LVL3 (GOWN DISPOSABLE) ×4 IMPLANT
KIT ABG SYR 3ML LUER SLIP (SYRINGE) IMPLANT
NDL HYPO 25X5/8 SAFETYGLIDE (NEEDLE) IMPLANT
NEEDLE HYPO 25X5/8 SAFETYGLIDE (NEEDLE) ×2 IMPLANT
NS IRRIG 1000ML POUR BTL (IV SOLUTION) ×2 IMPLANT
PACK C SECTION WH (CUSTOM PROCEDURE TRAY) ×2 IMPLANT
PAD OB MATERNITY 4.3X12.25 (PERSONAL CARE ITEMS) ×2 IMPLANT
RTRCTR C-SECT PINK 25CM LRG (MISCELLANEOUS) IMPLANT
STRIP CLOSURE SKIN 1/2X4 (GAUZE/BANDAGES/DRESSINGS) ×1 IMPLANT
SUT MNCRL 0 VIOLET CTX 36 (SUTURE) ×2 IMPLANT
SUT MON AB 2-0 CT1 27 (SUTURE) ×2 IMPLANT
SUT MONOCRYL 0 CTX 36 (SUTURE) ×2
SUT PLAIN 0 NONE (SUTURE) IMPLANT
SUT PLAIN 2 0 (SUTURE)
SUT PLAIN ABS 2-0 CT1 27XMFL (SUTURE) IMPLANT
SUT VIC AB 0 CT1 27 (SUTURE) ×2
SUT VIC AB 0 CT1 27XBRD ANBCTR (SUTURE) ×2 IMPLANT
SUT VIC AB 2-0 CT1 27 (SUTURE) ×1
SUT VIC AB 2-0 CT1 TAPERPNT 27 (SUTURE) ×1 IMPLANT
SUT VIC AB 4-0 KS 27 (SUTURE) ×2 IMPLANT
SUT VICRYL 0 TIES 12 18 (SUTURE) IMPLANT
TOWEL OR 17X24 6PK STRL BLUE (TOWEL DISPOSABLE) ×2 IMPLANT
TRAY FOLEY W/BAG SLVR 14FR LF (SET/KITS/TRAYS/PACK) IMPLANT
WATER STERILE IRR 1000ML POUR (IV SOLUTION) ×2 IMPLANT

## 2021-09-20 NOTE — Lactation Note (Signed)
This note was copied from a baby's chart. Lactation Consultation Note  Patient Name: Brianna Lucero [id number] Date: 09/20/2021 Reason for consult: Initial assessment;Primapara;1st time breastfeeding;Other (Comment);NICU baby;Preterm <34wks;Infant < 6lbs (IUGR, IVF (donor egg), AMA,) Age:45 hours  Visited with mom of 3 hours old pre-term NICU female, she's a P1 and reported (+) breast changes during the pregnancy. LC assisted with hand expression (no colostrum noted yet) and the initiation of pumping.   Reviewed pumping schedule, lactogenesis II, benefits of premature milk and expectations. Discussed the differences between a hospital grade pump, a full size single user pump and a wearable pump; she has a Musician.  Maternal Data Has patient been taught Hand Expression?: Yes Does the patient have breastfeeding experience prior to this delivery?: No  Feeding Mother's Current Feeding Choice: Breast Milk  Lactation Tools Discussed/Used Tools: Pump;Coconut oil;Flanges Flange Size: 24 Breast pump type: Double-Electric Breast Pump Pump Education: Setup, frequency, and cleaning;Milk Storage Reason for Pumping: pre-term in NICU Pumping frequency: q 3 hours Pumped volume: 0 mL  Interventions Interventions: Breast feeding basics reviewed;Breast massage;Hand express;DEBP;Coconut oil;Education;"The NICU and Your Baby" book;LC Services brochure  Plan of care Encouraged mom to start pumping every 3 hours, at least 8 pumping sessions/24 hours Hand expression, breast massage and coconut oil were also encouraged prior pumping OB Specialty care RN Crystal to place order for a Stork pump, mom is eligible  FOB present and supportive. All questions and concerns answered, parents to call NICU LC PRN.  Discharge Pump: DEBP;Personal (Mom Cozy)  Consult Status Consult Status: Follow-up Date: 09/20/21 Follow-up type: In-patient   Brianna Lucero 09/20/2021, 6:43 PM

## 2021-09-20 NOTE — Anesthesia Postprocedure Evaluation (Signed)
Anesthesia Post Note  Patient: Angelette Ganus  Procedure(s) Performed: [place]     Patient location during evaluation: Mother Baby Anesthesia Type: Spinal Level of consciousness: oriented and awake and alert Pain management: pain level controlled Vital Signs Assessment: post-procedure vital signs reviewed and stable Respiratory status: spontaneous breathing and respiratory function stable Cardiovascular status: blood pressure returned to baseline and stable Postop Assessment: no headache, no backache, no apparent nausea or vomiting and spinal receding Anesthetic complications: no   No notable events documented.  Last Vitals:  Vitals:   09/20/21 1645 09/20/21 1700  BP: 129/84 127/82  Pulse: 72 65  Resp: 19 17  Temp:    SpO2: 96% 94%    Last Pain:  Vitals:   09/20/21 1630  TempSrc: Oral  PainSc:    Pain Goal: Patients Stated Pain Goal: 5 (09/11/21 0827)              Epidural/Spinal Function Cutaneous sensation: Able to Wiggle Toes (09/20/21 1700), Patient able to flex knees: Yes (09/20/21 1700), Patient able to lift hips off bed: No (09/20/21 1700), Back pain beyond tenderness at insertion site: No (09/20/21 1700), Progressively worsening motor and/or sensory loss: No (09/20/21 1700), Bowel and/or bladder incontinence post epidural: No (09/20/21 1700)  Merlinda Frederick

## 2021-09-20 NOTE — Anesthesia Procedure Notes (Signed)
Spinal  Patient location during procedure: OR Start time: 09/20/2021 2:30 PM End time: 09/20/2021 2:35 PM Reason for block: surgical anesthesia Staffing Performed: anesthesiologist  Anesthesiologist: Merlinda Frederick, MD Preanesthetic Checklist Completed: patient identified, IV checked, risks and benefits discussed, surgical consent, monitors and equipment checked, pre-op evaluation and timeout performed Spinal Block Patient position: sitting Prep: DuraPrep and site prepped and draped Patient monitoring: continuous pulse ox, blood pressure and heart rate Approach: midline Location: L3-4 Injection technique: single-shot Needle Needle type: Pencan  Needle gauge: 24 G Needle length: 10 cm Assessment Events: CSF return Additional Notes Functioning IV was confirmed and monitors were applied. Sterile prep and drape, including hand hygiene and sterile gloves were used. The patient was positioned and the spine was prepped. The skin was anesthetized with lidocaine.  Free flow of clear CSF was obtained prior to injecting local anesthetic into the CSF. The needle was carefully withdrawn. The patient tolerated the procedure well.

## 2021-09-20 NOTE — Transfer of Care (Signed)
Immediate Anesthesia Transfer of Care Note  Patient: Brianna Lucero  Procedure(s) Performed: CESAREAN SECTION  Patient Location: PACU  Anesthesia Type:Spinal  Level of Consciousness: awake, alert  and oriented  Airway & Oxygen Therapy: Patient Spontanous Breathing  Post-op Assessment: Report given to RN and Post -op Vital signs reviewed and stable  Post vital signs: Reviewed and stable  Last Vitals:  Vitals Value Taken Time  BP 126/80 09/20/21 1615  Temp 36.8 C 09/20/21 1600  Pulse 63 09/20/21 1616  Resp 17 09/20/21 1616  SpO2 96 % 09/20/21 1616  Vitals shown include unvalidated device data.  Last Pain:  Vitals:   09/20/21 1600  TempSrc: Oral  PainSc:       Patients Stated Pain Goal: 5 (90/47/53 3917)  Complications: No notable events documented.

## 2021-09-20 NOTE — Progress Notes (Signed)
Brianna Lucero 12/09/1975 [id number]  HPI: 34L [id number] @ 27.0 with pregnancy complicated by cHTN, admitted with worsening Bps and IUGR now with abnormal UAD  HD#23  Overnight patient did have a severe BP of 166/91 at 2249 and repeat 15 minutes later came down to 164/83- although still technically severe, these were taken shortly after PM Labetalol dosing and patient with known BP trend of spiking before bed and then normalizing overnight. She was asymptomatic and FHT category I. Due to concern of dropping BP too low with IV push, a repeat BP was performed after 30 minutes which came down to her baseline of 152/75 and subsequent BP check was 138/73.  This morning I received a call from RN reporting severe range BP of 177/95 at 0850 and repeat check after 15 minutes was 173/93. Although patient did receive AM Procardia 60XL slightly later than usual- given at 0905- given persistent severe range and patient reported slight onset of HA, decision was made to initiate IV Labetalol protocol. She received 20 mg IV labetalol with no response and decision was made to start IV Magnesium protocol. She received 40 mg IV labetalol with no response, therefore switched to Hydralazine protocol- the 5 mg IV brought BP down to 158/86. Magnesium 6 g bolus for neuroprotection and severe BP's initiated at 1013.   During this time patient was seen and evaluated by myself at the bedside with RN present. Patient reports sleeping well overnight, minimal HA this morning, no vision changes. Denies any CP or SOB. No RUQ or epigastric pain. Good FM appreciated and no complaints of CTXs, VB, or LOF.  Physical Exam: -General: AAO, NAD -Heart: RRR -Lungs: CTABL no audible rales or crackles -Abdomen: gravid uterus, non-tender -Extremities: trace LE edema  Continuous Fetal Monitoring initiated at time of first severe BP this morning -Reassuring for gestational age- baseline 135 bpm moderate variability, 10x10 accels, intermittent  small variable decels spontaneously recover to baseline  -Acontractile on toco  CMP Latest Ref Rng & Units 09/20/2021 09/19/2021 09/18/2021  Glucose 70 - 99 mg/dL 95 112(H) 111(H)  BUN 6 - 20 mg/dL 14 12 10   Creatinine 0.44 - 1.00 mg/dL 1.01(H) 0.89 0.83  Sodium 135 - 145 mmol/L 134(L) 135 135  Potassium 3.5 - 5.1 mmol/L 4.1 4.2 4.2  Chloride 98 - 111 mmol/L 105 103 105  CO2 22 - 32 mmol/L 23 21(L) 20(L)  Calcium 8.9 - 10.3 mg/dL 8.8(L) 9.2 9.3  Total Protein 6.5 - 8.1 g/dL 5.1(L) 5.5(L) 5.5(L)  Total Bilirubin 0.3 - 1.2 mg/dL 0.3 0.3 0.2(L)  Alkaline Phos 38 - 126 U/L 59 65 74  AST 15 - 41 U/L 27 40 43(H)  ALT 0 - 44 U/L 39 53(H) 53(H)   CBC Latest Ref Rng & Units 09/20/2021 09/19/2021 09/18/2021  WBC 4.0 - 10.5 K/uL 11.0(H) 14.6(H) 13.7(H)  Hemoglobin 12.0 - 15.0 g/dL 11.2(L) 11.7(L) 12.5  Hematocrit 36.0 - 46.0 % 34.3(L) 35.7(L) 36.7  Platelets 150 - 400 K/uL 186 192 188    A/P: 45Y G3P0020 @ 27.0 with cHTN now with superimposed PEC by BP's and IUGR with abnormal UAD with intermittent REDV  Patient is now clinically stable with BP's back into her baseline of 150s/80s s/p IV Labetalol and Hydralazine pushes and Magnesium bolus. FHT reassuring without any evidence of distress.   Discussed with patient due to severe range blood pressures meeting criteria for superimposed severe PEC and need to start Magnesium in setting of IUGR and REDV on UAD- as previously discussed  with MFM, recommend moving towards delivery. Since both patient and fetal status are stable, will delay delivery for added fetal benefit of Magnesium neuroprotection. If patient BP's become severe range again or fetal status changes, would proceed with delivery at that time, with MOD being primary cesarean section. Patient has been previously consented for cesarean section, and reviewed again this morning risks of surgery including but not limited to bleeding, infection, and damage to surrounding organs. She is agreeable to blood  transfusion if indicated.   Will keep continuous EFM/Toco Magnesium maintenance 1g/hr due to slightly elevated Cr 1.01 this AM AM labs otherwise stable with normalization of LFTs and normal platelets, plan to repeat labs in 4hrs along with Mag level Close BP monitoring per protocol Remain NPO Strict I&Os  OR team notified of patient status and likely add on case for this afternoon Oncoming provider notified of patient status and will assume care and make final call on delivery timing if not delivered in the interim  Mivaan Corbitt A Zerek Litsey 09/20/21 10:57 AM

## 2021-09-20 NOTE — Op Note (Signed)
Cesarean Section Procedure Note   Brianna Lucero  09/20/2021 Procedure: Emergency Primary Classical cesarean section and myomectomy   Indications:  Chronic hypertension with superimposed preeclampsia, 27 weeks, severe IUGR   Fibroid uterus   Pre-operative Diagnosis: severe superimposed preeclampsia, IUGR, 27 weeks                                             Fibroid uterus   Post-operative Diagnosis: Same   Surgeon: Azucena Fallen, MD   Assistants: Gavin Potters CNM   Anesthesia: spinal   Procedure Details:  The patient was seen in the Antenatal Room. She has chronic hypertension now worsening BPs with severe preeclampsia. She was in house for 3 weeks due to severe IUGR and abnormal Dopplers and off and on severe range BPs that started to get worse since 2-3 days. She was given rescue steroid course and magnesium for 5 hours for fetal neuro protection and moved to the OR for emergency c-section. The risks, benefits, complications, treatment options, and expected outcomes were discussed with the patient. The patient concurred with the proposed plan, giving informed consent. identified as Brianna Lucero and the procedure verified as C-Section Delivery.  In OR A Time Out was held and the above information confirmed. 2 gm Ancef given. After induction of spinal anesthesia, the patient was draped and prepped in the usual sterile manner, foley was draining urine well.  A pfannenstiel incision was made and carried down through the subcutaneous tissue to the fascia. Fascial incision was made and extended transversely. The fascia was separated from the underlying rectus tissue superiorly and inferiorly. The peritoneum was identified and entered. Peritoneal incision was extended longitudinally. Alexis-O retractor placed. Lower segment was not developed well. She has known anterior fibroid. A vertical Classical C-section was made starting low in lower segment and extending superiorly. Amniotomy noted  yellow  tinged amniotic fluid. Baby was cephalic but I could not deliver the head forward and palpated anterior submucosal myoma around 6 cm that was obstructing head delivery. So incision was extended around the fibroid and I retracted the fibroid and excised myometrium and lift off the myoma and create more room for delivery. Now head delivery was completed with gentle fundal pressure. At head delivery nuchal cord x 3 noted and was released over the head and then delivery was completed. Baby was active and breathing and cried, so we were allowed delayed cord clamping her NICU. At 1 minute, cord was clamped and cut and baby handed to NICU team. Cord ph was sent. Cord blood was obtained for evaluation. After baby was handed off, TXA 1 gm given. The placenta was removed Intact and appeared small. The tubes and ovaries appeared normal. Uterine incision was assessed. First I sutured endometrium back to the opposite side with 2-0 Monocryl. Then myometrium closed in 2 layers of 0-Monocryl. Then serosa with 2-0 Monocryl in baseball stitch fashion. Hemostasis was excellent. Alexis retractor removed. Interceed placed on hysterotomy closure. Peritoneal closure done with 2-0 Vicryl.  The fascia was then reapproximated with running sutures of 0Vicryl. The subcuticular closure was performed using 2-0plain gut. The skin was closed with 4-0Vicryl. Steristrips and honeycomb dressing placed.  Instrument, sponge, and needle counts were correct prior the abdominal closure and were correct at the conclusion of the case.    Findings: Female infant, severe IUGR, 27 wks, delivered by Classical c-section, myomectomy of  SM myoma needed before head delivery was possible.    Estimated Blood Loss: 450 cc   Total IV Fluids: 1000 ml LR   Urine Output:  50CC OF clear urine  Specimens: cord gas, cord blood, placenta, and fibroid  Complications: no complications  Disposition: PACU - hemodynamically stable.   Maternal Condition: stable    Baby condition / location:  NICU  Attending Attestation: I performed the procedure.   Signed: Surgeon(s): Azucena Fallen, MD

## 2021-09-20 NOTE — Anesthesia Preprocedure Evaluation (Addendum)
Anesthesia Evaluation  Patient identified by MRN, date of birth, ID band Patient awake    Reviewed: Allergy & Precautions, NPO status , Patient's Chart, lab work & pertinent test results  Airway Mallampati: III  TM Distance: >3 FB Neck ROM: Full    Dental no notable dental hx.    Pulmonary neg pulmonary ROS,    Pulmonary exam normal breath sounds clear to auscultation       Cardiovascular hypertension (preeclampsia on Mag), Pt. on medications Normal cardiovascular exam Rhythm:Regular Rate:Normal     Neuro/Psych negative neurological ROS  negative psych ROS   GI/Hepatic Neg liver ROS, GERD  ,  Endo/Other  negative endocrine ROS  Renal/GU negative Renal ROS  negative genitourinary   Musculoskeletal negative musculoskeletal ROS (+)   Abdominal   Peds negative pediatric ROS (+)  Hematology negative hematology ROS (+)   Anesthesia Other Findings   Reproductive/Obstetrics (+) Pregnancy                            Anesthesia Physical Anesthesia Plan  ASA: 3  Anesthesia Plan: Spinal   Post-op Pain Management:    Induction:   PONV Risk Score and Plan: 2 and Scopolamine patch - Pre-op, Treatment may vary due to age or medical condition, Ondansetron and Dexamethasone  Airway Management Planned: Natural Airway  Additional Equipment: None  Intra-op Plan:   Post-operative Plan:   Informed Consent: I have reviewed the patients History and Physical, chart, labs and discussed the procedure including the risks, benefits and alternatives for the proposed anesthesia with the patient or authorized representative who has indicated his/her understanding and acceptance.     Dental advisory given  Plan Discussed with: CRNA, Anesthesiologist and Surgeon  Anesthesia Plan Comments: (27 weeks, 3days with severe preeclampsia and IUGR. Spinal. GETA as backup plan. Current COVID test pending. Norton Blizzard, MD  Dr. Benjie Karvonen has decided to proceed without COVID results. Will adhere to COVID precautions. Norton Blizzard, MD   )      Anesthesia Quick Evaluation

## 2021-09-21 LAB — COMPREHENSIVE METABOLIC PANEL
ALT: 37 U/L (ref 0–44)
AST: 30 U/L (ref 15–41)
Albumin: 2.5 g/dL — ABNORMAL LOW (ref 3.5–5.0)
Alkaline Phosphatase: 68 U/L (ref 38–126)
Anion gap: 9 (ref 5–15)
BUN: 13 mg/dL (ref 6–20)
CO2: 23 mmol/L (ref 22–32)
Calcium: 7.7 mg/dL — ABNORMAL LOW (ref 8.9–10.3)
Chloride: 99 mmol/L (ref 98–111)
Creatinine, Ser: 0.99 mg/dL (ref 0.44–1.00)
GFR, Estimated: >60 mL/min (ref >60)
Glucose, Bld: 101 mg/dL — ABNORMAL HIGH (ref 70–99)
Potassium: 4.6 mmol/L (ref 3.5–5.1)
Sodium: 131 mmol/L — ABNORMAL LOW (ref 135–145)
Total Bilirubin: <0.1 mg/dL — ABNORMAL LOW (ref 0.3–1.2)
Total Protein: 5.3 g/dL — ABNORMAL LOW (ref 6.5–8.1)

## 2021-09-21 LAB — CBC
HCT: 36.7 % (ref 36.0–46.0)
Hemoglobin: 11.9 g/dL — ABNORMAL LOW (ref 12.0–15.0)
MCH: 27.8 pg (ref 26.0–34.0)
MCHC: 32.4 g/dL (ref 30.0–36.0)
MCV: 85.7 fL (ref 80.0–100.0)
Platelets: 210 10*3/uL (ref 150–400)
RBC: 4.28 MIL/uL (ref 3.87–5.11)
RDW: 14.3 % (ref 11.5–15.5)
WBC: 16.4 10*3/uL — ABNORMAL HIGH (ref 4.0–10.5)
nRBC: 0 % (ref 0.0–0.2)

## 2021-09-21 LAB — MAGNESIUM: Magnesium: 6.3 mg/dL (ref 1.7–2.4)

## 2021-09-21 MED ORDER — ACYCLOVIR 400 MG PO TABS
400.0000 mg | ORAL_TABLET | Freq: Two times a day (BID) | ORAL | Status: DC
  Administered 2021-09-21 – 2021-09-23 (×5): 400 mg via ORAL
  Filled 2021-09-21 (×6): qty 1

## 2021-09-21 MED ORDER — MAGNESIUM SULFATE 40 GM/1000ML IV SOLN: 1.0000 g/h | INTRAVENOUS | Status: DC

## 2021-09-21 MED ORDER — DOCUSATE SODIUM 100 MG PO CAPS
100.0000 mg | ORAL_CAPSULE | Freq: Every day | ORAL | Status: DC | PRN
  Administered 2021-09-21: 100 mg via ORAL
  Filled 2021-09-21: qty 1

## 2021-09-21 NOTE — Progress Notes (Signed)
Low BPs, Hold Labetaolol, continue Procardia 60mg  XL q 12 hrs and will reassess if needs to reduce dose in 1-2 days

## 2021-09-21 NOTE — Progress Notes (Signed)
Patient reports not feeling pain at the moment. Postpartum assessment and Mag assessment WNL. Abdomen taught with hyperactive bowel sounds. Pt not yet passing gas. RN walked with patient around the unit 1 time. SCD's applied and patient resting in bed. Will continue to monitor.

## 2021-09-21 NOTE — Progress Notes (Signed)
Patient ID: Brianna Lucero, female   DOB: 06-Sep-1975, 46 y.o.   MRN: [id number] Mag level 6.3 RN advised to stop for 1.1/2 hrs and restart at 7.30 am at 1 gm /hr rate until 24 hrs from starting PP

## 2021-09-21 NOTE — Lactation Note (Signed)
This note was copied from a baby's chart. Lactation Consultation Note  Patient Name: Brianna Lucero [id number]'N Date: 09/21/2021 Reason for consult: Follow-up assessment;1st time breastfeeding;Primapara;NICU baby;Preterm <34wks;Infant < 6lbs;Other (Comment) (IUGR, IVF (donor egg), AMA) Age:45 hours  Visited with mom of 2 hours old pre-term NICU female, she's still on Mag and reports she's only pumped once this morning since [facility] assisted with pumping last night. Mom will be getting off Mag at 3 pm this afternoon; encouraged her to start pumping consistently; explained the importance of consistent pumping for the onset of lactogenesis II; she voiced understanding.  Assisted mom with hand expression again but no colostrum noted yet. Reviewed expectations and encouraged her to continue working on breast massage and hand expression prior her pumping sessions. Mom was happy to report that baby will start some trophic feedings on donor milk today, she wants to keep up with her pumping to provide baby with her own milk.  Maternal Data  Mom's supply is WNL  Feeding Mother's Current Feeding Choice: Breast Milk  Lactation Tools Discussed/Used Tools: Pump;Flanges;Coconut oil Flange Size: 24 Breast pump type: Double-Electric Breast Pump Pump Education: Setup, frequency, and cleaning;Milk Storage Reason for Pumping: pre-term in NICU Pumping frequency: 2 times/20 hours Pumped volume: 0 mL  Interventions Interventions: Breast feeding basics reviewed;Hand express;Breast massage;DEBP;Coconut oil;Education  Plan of care Encouraged mom to start pumping consistently every 3 hours, at least 8 pumping sessions/24 hours Hand expression, breast massage and coconut oil were also encouraged prior pumping OB Specialty care RN Estill Bamberg to place order for a Stork pump today, mom is eligible   FOB present and supportive. All questions and concerns answered, parents to call NICU LC PRN.  Discharge Pump:  DEBP;Personal (Mom Cozy)  Consult Status Consult Status: Follow-up Date: 09/21/21 Follow-up type: In-patient   Brianna Lucero 09/21/2021, 11:20 AM

## 2021-09-21 NOTE — Progress Notes (Signed)
Subjective: Postpartum Day 1: Cesarean Delivery- Classical C-section and myomectomy. Emerge C/s for superimposed PEC at 27 wks HD# 24, admitted at 23.6 wks for severe IUGR, chronic hypertension with worsening BPs and abnormal UAD   Patient reports foggy head. No vision changes/ severe HA/ RUQ pain/ CP / SOB.   Objective: Vital signs in last 24 hours: Temp:  [97.4 F (36.3 C)-98.2 F (36.8 C)] 97.5 F (36.4 C) (02/04 0444) Pulse Rate:  [58-79] 58 (02/04 0444) Resp:  [15-25] 17 (02/04 0444) BP: (115-177)/(73-95) 115/73 (02/04 0444) SpO2:  [94 %-100 %] 98 % (02/04 0444) Weight:  [105.5 kg] 105.5 kg (02/04 0447) Patient Vitals for the past 24 hrs:  BP Temp Temp src Pulse Resp SpO2 Weight  09/21/21 0447 -- -- -- -- -- -- 105.5 kg  09/21/21 0444 115/73 (!) 97.5 F (36.4 C) Oral (!) 58 17 98 % --  09/20/21 1940 130/82 -- Oral 65 17 98 % --  09/20/21 1845 124/78 (!) 97.5 F (36.4 C) Oral 65 18 98 % --  09/20/21 1744 119/73 (!) 97.5 F (36.4 C) Oral 71 18 98 % --  09/20/21 1722 -- -- -- 66 15 97 % --  09/20/21 1721 -- -- -- 67 17 96 % --  09/20/21 1720 -- -- -- 67 18 96 % --  09/20/21 1719 -- -- -- 67 18 96 % --  09/20/21 1718 -- -- -- 68 16 96 % --  09/20/21 1717 -- -- -- 67 16 95 % --  09/20/21 1716 -- -- -- 70 16 96 % --  09/20/21 1715 128/79 98.1 F (36.7 C) Temporal 71 15 99 % --  09/20/21 1714 -- -- -- 71 19 98 % --  09/20/21 1713 -- -- -- 70 (!) 22 96 % --  09/20/21 1712 -- -- -- 71 (!) 21 96 % --  09/20/21 1711 -- -- -- 68 (!) 22 94 % --  09/20/21 1710 -- -- -- 67 18 96 % --  09/20/21 1709 -- -- -- 70 19 94 % --  09/20/21 1708 -- -- -- 66 (!) 24 95 % --  09/20/21 1707 -- -- -- 67 (!) 25 95 % --  09/20/21 1706 -- -- -- 68 (!) 21 96 % --  09/20/21 1705 -- -- -- 66 19 95 % --  09/20/21 1704 -- -- -- 69 20 96 % --  09/20/21 1703 -- -- -- 71 15 97 % --  09/20/21 1702 -- -- -- 71 20 94 % --  09/20/21 1701 -- -- -- 66 17 94 % --  09/20/21 1700 127/82 -- -- 65 17 94 % --   09/20/21 1645 129/84 -- -- 72 19 96 % --  09/20/21 1630 125/82 (!) 97.4 F (36.3 C) Oral 65 17 97 % --  09/20/21 1615 126/80 -- -- 63 17 98 % --  09/20/21 1600 127/80 98.2 F (36.8 C) Oral 62 18 -- --  09/20/21 1400 (!) 147/85 97.6 F (36.4 C) Oral 72 20 98 % --  09/20/21 1300 (!) 144/84 -- -- 70 19 98 % --  09/20/21 1200 (!) 144/86 -- -- 67 17 97 % --  09/20/21 1100 (!) 159/88 97.6 F (36.4 C) Oral 78 18 97 % --  09/20/21 1035 (!) 150/86 97.6 F (36.4 C) Oral 79 19 97 % --  09/20/21 1030 -- -- -- -- -- 96 % --  09/20/21 1025 -- -- -- -- -- 96 % --  09/20/21  1024 (!) 150/80 97.8 F (36.6 C) Oral 79 19 98 % --  09/20/21 1020 -- -- -- -- -- 98 % --  09/20/21 1015 -- -- -- -- -- 99 % --  09/20/21 1010 (!) 158/86 97.8 F (36.6 C) Oral 67 20 100 % --  09/20/21 0955 -- -- -- -- -- 98 % --  09/20/21 0953 (!) 174/89 -- -- 67 -- -- --  09/20/21 0950 -- -- -- -- -- 98 % --  09/20/21 0945 -- -- -- -- -- 99 % --  09/20/21 0940 (!) 171/91 -- -- 67 -- 98 % --  09/20/21 0939 -- -- -- -- -- 98 % --  09/20/21 0935 -- -- -- -- -- 98 % --  09/20/21 0930 -- -- -- -- -- 99 % --  09/20/21 0928 (!) 172/94 -- -- 69 -- -- --  09/20/21 0925 -- -- -- -- -- 98 % --  09/20/21 0920 -- -- -- -- -- 98 % --  09/20/21 0915 -- -- -- -- -- 98 % --  09/20/21 0910 -- -- -- -- -- 98 % --  09/20/21 0905 -- -- -- -- -- 98 % --  09/20/21 0904 (!) 173/93 -- -- 70 -- -- --  09/20/21 0900 -- -- -- -- -- 97 % --  09/20/21 0855 -- -- -- -- -- 97 % --  09/20/21 0850 (!) 177/95 98.1 F (36.7 C) Oral 75 18 98 % --   Weight change: -1.724 kg    Intake/Output Summary (Last 24 hours) at 09/21/2021 0720 Last data filed at 09/21/2021 9983 Gross per 24 hour  Intake 3154.17 ml  Output 4439 ml  Net -1284.83 ml    Physical Exam:  General: alert and cooperative Lungs CTA bilat CV RRR S1S2 nl Lochia: appropriate Uterine Fundus: firm Abdo NABS, NT/NG.  Incision: healing well, no significant drainage DVT Evaluation: No  evidence of DVT seen on physical exam. DTR +1  CBC Latest Ref Rng & Units 09/21/2021 09/20/2021 09/20/2021  WBC 4.0 - 10.5 K/uL 16.4(H) 9.7 11.0(H)  Hemoglobin 12.0 - 15.0 g/dL 11.9(L) 13.1 11.2(L)  Hematocrit 36.0 - 46.0 % 36.7 38.7 34.3(L)  Platelets 150 - 400 K/uL 210 221 186    CMP Latest Ref Rng & Units 09/21/2021 09/20/2021 09/20/2021  Glucose 70 - 99 mg/dL 101(H) 84 95  BUN 6 - 20 mg/dL 13 9 14   Creatinine 0.44 - 1.00 mg/dL 0.99 0.88 1.01(H)  Sodium 135 - 145 mmol/L 131(L) 133(L) 134(L)  Potassium 3.5 - 5.1 mmol/L 4.6 3.9 4.1  Chloride 98 - 111 mmol/L 99 105 105  CO2 22 - 32 mmol/L 23 21(L) 23  Calcium 8.9 - 10.3 mg/dL 7.7(L) 8.6(L) 8.8(L)  Total Protein 6.5 - 8.1 g/dL 5.3(L) 5.6(L) 5.1(L)  Total Bilirubin 0.3 - 1.2 mg/dL <0.1(L) <0.1(L) 0.3  Alkaline Phos 38 - 126 U/L 68 72 59  AST 15 - 41 U/L 30 28 27   ALT 0 - 44 U/L 37 40 39      Assessment/Plan: POD #1 Emergency Classical C/section and myomectomy at 27 wks, severe IUGR, Girl, in NICU, stable  Routine post-op care/ pain mngmt  CHTN with superimposed severe preeclampsia, Magnesium 6.3 this morning, so held for 1.1/2 hrs and will restart at 1 gm/ hr from 7.30 am till 4 pm to complete 24 hrs  Strict I/O and daily weight  BP controlled well - Procardia XL 60mg  BID and Labetalol 600mg  TID - will titrate down gradually as  indicated  HSV Hx - restart Acyclovir    Brianna Lucero 09/21/2021, 6:58 AM

## 2021-09-22 LAB — COMPREHENSIVE METABOLIC PANEL
ALT: 28 U/L (ref 0–44)
AST: 22 U/L (ref 15–41)
Albumin: 2.3 g/dL — ABNORMAL LOW (ref 3.5–5.0)
Alkaline Phosphatase: 59 U/L (ref 38–126)
Anion gap: 8 (ref 5–15)
BUN: 13 mg/dL (ref 6–20)
CO2: 23 mmol/L (ref 22–32)
Calcium: 7.9 mg/dL — ABNORMAL LOW (ref 8.9–10.3)
Chloride: 104 mmol/L (ref 98–111)
Creatinine, Ser: 0.95 mg/dL (ref 0.44–1.00)
GFR, Estimated: >60 mL/min (ref >60)
Glucose, Bld: 89 mg/dL (ref 70–99)
Potassium: 4 mmol/L (ref 3.5–5.1)
Sodium: 135 mmol/L (ref 135–145)
Total Bilirubin: 0.2 mg/dL — ABNORMAL LOW (ref 0.3–1.2)
Total Protein: 4.9 g/dL — ABNORMAL LOW (ref 6.5–8.1)

## 2021-09-22 MED ORDER — FUROSEMIDE 40 MG PO TABS
20.0000 mg | ORAL_TABLET | Freq: Every day | ORAL | Status: DC
  Administered 2021-09-22 – 2021-09-23 (×2): 20 mg via ORAL
  Filled 2021-09-22 (×2): qty 1

## 2021-09-22 NOTE — Clinical Social Work Maternal (Signed)
CLINICAL SOCIAL WORK MATERNAL/CHILD NOTE  Patient Details  Name: Brianna Lucero MRN: [id number] Date of Birth: 01-13-1976  Date:  09/22/2021  Clinical Social Worker Initiating Note:  Abundio Miu, [place] Date/Time: Initiated:  09/22/21/1335     Child's Name:  Brianna Lucero   Biological Parents:  Mother, Father (Father: Phineas Douglas)   Need for Interpreter:  None   Reason for Referral:  Parental Support of Premature Babies < 31 weeks/or Critically Ill babies, Behavioral Health Concerns   Address:  [address] [place] [address]    Phone number:  [phone] (home)     Additional phone number:   Household Members/Support Persons (HM/SP):   Household Member/Support Person 1, Household Member/Support Person 2   HM/SP Name Relationship DOB or Age  HM/SP -1   dad    HM/SP -2 Phineas Douglas FOB    HM/SP -3        HM/SP -4        HM/SP -5        HM/SP -6        HM/SP -7        HM/SP -8          Natural Supports (not living in the home):      Professional Supports: None   Employment: Full-time   Type of Work: Investment banker, corporate   Education:  Pensions consultant   Homebound arranged:    Museum/gallery curator Resources:  Multimedia programmer    Other Resources:      Cultural/Religious Considerations Which May Impact Care:    Strengths:  Ability to meet basic needs  , Understanding of illness   Psychotropic Medications:         Pediatrician:       Pediatrician List:   [place]      Pediatrician Fax Number:    Risk Factors/Current Problems:  None   Cognitive State:  Alert  , Able to Concentrate  , Insightful  , Linear Thinking  , Goal Oriented     Mood/Affect:  Calm  , Interested  , Relaxed     CSW Assessment: CSW met with MOB at bedside to complete psychosocial assessment, FOB present. CSW introduced self and explained role. FOB reported that he would go visit with  infant in the NICU to give MOB some privacy to speak with CSW, FOB left the room. MOB was welcoming, pleasant, open, and remained engaged during assessment. MOB reported that she resides with FOB and her dad. 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 MOB reported that they have not started to shop for infant yet, but anticipates being able to get all needed items prior to discharge. CSW inquired about MOB's support system, MOB reported that her dad and FOB are supports. MOB shared that she just moved here a year ago and her family is in the [place].   CSW inquired about MOB's mental health history. MOB reported that she was diagnosed with Anxiety and Depression 10-12 years ago. MOB denied any current symptoms. MOB shared that it has been hard being hospitalized as she is used to working daily. CSW acknowledged and validated MOB's feelings surrounding her experience. CSW and MOB discussed edinburgh score 13. MOB reported that the score is based on her last 2 weeks being admitted and uncertainty surrounding how Antony Sian she would be in the hospital. MOB reported  that now that she has delivered she doesn't feel that her edinburgh score would be as high. CSW inquired about how MOB was feeling emotionally after giving birth, MOB reported that she was feeling good, relieved, and a lot better now. CSW acknowledged, normalized, and validated MOB's feelings. CSW and MOB discussed emotions that can arise from NICU admission. MOB presented calm and did not demonstrate any acute mental health signs/symptoms. CSW assessed for safety, MOB denied SI, HI, and domestic violence.   CSW provided education regarding the baby blues period vs. perinatal mood disorders, discussed treatment and gave resources for mental health follow up if concerns arise.  CSW recommends self-evaluation during the postpartum time period using the New Mom Checklist from Postpartum Progress and encouraged MOB to contact a medical professional if symptoms  are noted at any time.    CSW provided review of Sudden Infant Death Syndrome (SIDS) precautions.    CSW and MOB discussed infant's NICU admission. CSW informed MOB about the NICU, what to expect, and resources/supports available while infant is admitted to the NICU. MOB reported that they feel well informed about infant's care. MOB shared about readiness to hold infant and participate in skin to skin, CSW normalized MOB's readiness to engage in skin to skin with infant. MOB shared that she spoke with NICU staff about this. MOB denied any transportation barriers with visiting infant in the NICU. MOB denied any questions/concerns regarding the NICU. CSW informed MOB that infant qualifies to apply for SSI benefits, MOB reported that she is interested. CSW went over SSI benefits and application process. CSW agreed to place paperwork regarding SSI application process at infant's bedside.   CSW will continue to offer resources/supports while infant is admitted to the NICU.    CSW Plan/Description:  Psychosocial Support and Ongoing Assessment of Needs, Sudden Infant Death Syndrome (SIDS) Education, Perinatal Mood and Anxiety Disorder (PMADs) Education, [employer] Income (SSI) Information    Burnis Medin, LCSW 09/22/2021, 1:39 PM

## 2021-09-22 NOTE — Progress Notes (Signed)
Subjective: Postpartum Day 2: Cesarean Delivery- Classical C-section and myomectomy. Emerge C/s for superimposed PEC at 27 wks HD# 25, admitted at 23.6 wks for severe IUGR, chronic hypertension with worsening BPs and abnormal UAD   S: Incision pain worse this morning but Oxycodone helped. Ambulating, voiding, flatus + but no BM . No HA/ RUQ pain/ vision changes.   Objective: Vital signs in last 24 hours: Temp:  [97.8 F (36.6 C)-99.3 F (37.4 C)] 99.3 F (37.4 C) (02/05 0714) Pulse Rate:  [59-79] 79 (02/05 0714) Resp:  [15-19] 19 (02/05 0714) BP: (108-136)/(65-77) 136/77 (02/05 0714) SpO2:  [97 %-99 %] 99 % (02/05 0714) Weight:  [105.4 kg] 105.4 kg (02/05 0500) Patient Vitals for the past 24 hrs:  BP Temp Temp src Pulse Resp SpO2 Weight  09/22/21 0714 136/77 99.3 F (37.4 C) Oral 79 19 99 % --  09/22/21 0500 -- -- -- -- -- -- 105.4 kg  09/22/21 0419 129/74 98.5 F (36.9 C) Oral 75 18 97 % --  09/21/21 2330 123/71 97.8 F (36.6 C) Oral 71 16 -- --  09/21/21 1936 130/71 97.8 F (36.6 C) Oral 70 16 99 % --  09/21/21 1520 109/65 98 F (36.7 C) Oral 60 17 98 % --  09/21/21 1500 -- -- -- -- 17 -- --  09/21/21 1400 -- -- -- -- 16 -- --  09/21/21 1300 -- -- -- -- 16 -- --  09/21/21 1143 108/68 98 F (36.7 C) Oral (!) 59 15 99 % --    Weight change: -0.091 kg    Intake/Output Summary (Last 24 hours) at 09/22/2021 1116 Last data filed at 09/22/2021 0800 Gross per 24 hour  Intake 1619.61 ml  Output 3200 ml  Net -1580.39 ml     Physical Exam:  General: alert and cooperative Lungs CTA bilat CV RRR S1S2 nl Lochia: appropriate Uterine Fundus: firm Abdo NABS, NT/NG.  Incision: healing well, no significant drainage DVT Evaluation: No evidence of DVT seen on physical exam. DTR +1  CBC Latest Ref Rng & Units 09/21/2021 09/20/2021 09/20/2021  WBC 4.0 - 10.5 K/uL 16.4(H) 9.7 11.0(H)  Hemoglobin 12.0 - 15.0 g/dL 11.9(L) 13.1 11.2(L)  Hematocrit 36.0 - 46.0 % 36.7 38.7 34.3(L)   Platelets 150 - 400 K/uL 210 221 186    CMP Latest Ref Rng & Units 09/22/2021 09/21/2021 09/20/2021  Glucose 70 - 99 mg/dL 89 101(H) 84  BUN 6 - 20 mg/dL 13 13 9   Creatinine 0.44 - 1.00 mg/dL 0.95 0.99 0.88  Sodium 135 - 145 mmol/L 135 131(L) 133(L)  Potassium 3.5 - 5.1 mmol/L 4.0 4.6 3.9  Chloride 98 - 111 mmol/L 104 99 105  CO2 22 - 32 mmol/L 23 23 21(L)  Calcium 8.9 - 10.3 mg/dL 7.9(L) 7.7(L) 8.6(L)  Total Protein 6.5 - 8.1 g/dL 4.9(L) 5.3(L) 5.6(L)  Total Bilirubin 0.3 - 1.2 mg/dL 0.2(L) <0.1(L) <0.1(L)  Alkaline Phos 38 - 126 U/L 59 68 72  AST 15 - 41 U/L 22 30 28   ALT 0 - 44 U/L 28 37 40      Assessment/Plan: POD # 2 Emergency Classical C/section and myomectomy at 27 wks, severe IUGR, Girl, in NICU, stable  Routine post-op care/ pain mngmt  CHTN with superimposed severe preeclampsia, S/p magnesium 24 hrs, creatinine, LFTs wnl. I/O good. Diuresing well. Adding Lasix 20mg  daily for 3 days to prevent fluid overload   BP controlled well - Procardia XL 60mg  BID, not taking Labetalol now   HSV Hx -  restart Acyclovir  Anticipate Dc tomorrow if BPs remain well controlled. F/up with me in 4-5 days after discharge   Girl, NICU, severe IUGR, stable    Brianna Lucero 09/22/2021, 11:16 AM

## 2021-09-22 NOTE — Lactation Note (Signed)
This note was copied from a baby's chart. Lactation Consultation Note  Patient Name: Brianna Lucero [id number]'Q Date: 09/22/2021 Reason for consult: Follow-up assessment;NICU baby;1st time breastfeeding;Primapara;Preterm <34wks;Infant < 6lbs;Other (Comment) (IUGR, IVF (donor egg), AMA) Age:45 hours  Visited with mom of 80 hours old pre-term NICU female, she's a P1 and reports that she's not getting any drops yet either by hand expression or pumping. LC assisted again with hand expression; mom's breast tissue is dense and semi-compressible; no colostrum noted yet but she reported (+) breast changes during the pregnancy.   Mom got off Mag just yesterday, she voiced that she hasn't been pumping consistently but that she'll try to start getting in a routine today. Provided a pumping top, she's anticipating to be discharged tomorrow.  Maternal Data  Mom's supply is WNL  Feeding Mother's Current Feeding Choice: Breast Milk and Donor Milk  Lactation Tools Discussed/Used Tools: Pump;Flanges;Coconut oil Flange Size: 24 Breast pump type: Double-Electric Breast Pump Pump Education: Setup, frequency, and cleaning;Milk Storage Reason for Pumping: pre-term in NICU Pumping frequency: 3 times/24 hours Pumped volume: 0 mL  Interventions  Hand expression, breast massage, DEBP  Plan of care Encouraged mom to start pumping every 3 hours, at least 8 pumping sessions/24 hours Hand expression, breast massage and coconut oil were also encouraged prior pumping She'll take all her pump parts to baby's room the day of discharge   FOB present and supportive. All questions and concerns answered, parents to call NICU LC PRN.  Discharge Pump: DEBP;Stork Pump;Personal (Mom Cozy)  Consult Status Consult Status: Follow-up Date: 09/22/21 Follow-up type: In-patient   Brianna Lucero 09/22/2021, 11:00 AM

## 2021-09-23 MED ORDER — IBUPROFEN 600 MG PO TABS: 600.0000 mg | ORAL_TABLET | Freq: Four times a day (QID) | ORAL | 0 refills | Status: DC

## 2021-09-23 MED ORDER — ACYCLOVIR 400 MG PO TABS: 400.0000 mg | ORAL_TABLET | Freq: Two times a day (BID) | ORAL | 1 refills | Status: AC

## 2021-09-23 MED ORDER — LABETALOL HCL 200 MG PO TABS: 200.0000 mg | ORAL_TABLET | Freq: Three times a day (TID) | ORAL | 0 refills | Status: DC

## 2021-09-23 MED ORDER — ACETAMINOPHEN 325 MG PO TABS: 650.0000 mg | ORAL_TABLET | ORAL | 1 refills | Status: DC | PRN

## 2021-09-23 MED ORDER — OXYCODONE HCL 5 MG PO TABS: 5.0000 mg | ORAL_TABLET | ORAL | 0 refills | Status: DC | PRN

## 2021-09-23 MED ORDER — FUROSEMIDE 20 MG PO TABS: 20.0000 mg | ORAL_TABLET | Freq: Every day | ORAL | 0 refills | Status: DC

## 2021-09-23 MED ORDER — LABETALOL HCL 200 MG PO TABS
200.0000 mg | ORAL_TABLET | Freq: Three times a day (TID) | ORAL | Status: DC
  Administered 2021-09-23: 200 mg via ORAL
  Filled 2021-09-23: qty 1

## 2021-09-23 MED ORDER — NIFEDIPINE ER 60 MG PO TB24: 60.0000 mg | ORAL_TABLET | Freq: Two times a day (BID) | ORAL | 0 refills | Status: DC

## 2021-09-23 NOTE — Lactation Note (Signed)
This note was copied from a baby's chart. Lactation Consultation Note  Patient Name: Brianna Lucero [id number] Date: 09/23/2021 Reason for consult: Follow-up assessment;Primapara;NICU baby;1st time breastfeeding;Preterm <34wks Age:45 hours  I followed up with Brianna Lucero this morning. She was preparing to leave and visit her daughter in the NICU. We discussed her progress with using the breast pump. She states that she is trying to pump q3 hours, but has not yet expressed her breast milk. At 66 hours, I reassured her that the milk typically transitions between days 3-5, and with magnesium therapy, there is sometimes a slight delay in that transition.   I encouraged her to continue to pump consistently. We discussed adding in some shorter "power pump" sessions during the day today. I recommended that Brianna Lucero did not need to wait the full three hours.  We also reviewed pumping settings, and I showed her the maintenance setting to use once her volume is 20 mls 3 sessions in a row.   Brianna Lucero has her stork pump in the room (received), and she plans to take her DEBP kit to the baby's room upon discharge.   Maternal Data Does the patient have breastfeeding experience prior to this delivery?: No  Lactation Tools Discussed/Used Tools: Pump;Flanges;Coconut oil Flange Size: 24 Breast pump type: Double-Electric Breast Pump Pump Education: Setup, frequency, and cleaning Reason for Pumping: NICU Pumping frequency: rec q2-3 hours Pumped volume: 0 mL  Interventions Interventions: Breast feeding basics reviewed;DEBP;Education;Coconut oil  Discharge Pump: DEBP;Stork Pump  Consult Status Consult Status: Follow-up Date: 09/23/21 Follow-up type: In-patient    Lenore Manner 09/23/2021, 9:15 AM

## 2021-09-23 NOTE — Progress Notes (Signed)
Subjective: POD# 3 Live born female  Birth Weight: 1 lb 9.4 oz (720 g) APGAR: 4, 8  Newborn Delivery   Birth date/time: 09/20/2021 15:06:00 Delivery type: C-Section, Classical Trial of labor: No C-section categorization: Primary     Baby name: Lowell Guitar Delivering provider: MODY, VAISHALI   Pain control at delivery: Spinal   Reports feeling fine physically. Denies PEC symptoms. Tearful over elevated BP this AM and at noon. Strongly desires discharge home today. Baby stable in NICU.   Patient reports tolerating PO.   Pain controlled with acetaminophen and ibuprofen (OTC) Denies HA/SOB/C/P/N/V/dizziness. She reports vaginal bleeding as normal, without clots. She is ambulating and urinating without difficulty.     Objective:  Vitals:   09/23/21 0407 09/23/21 0441 09/23/21 0804 09/23/21 1200  BP: 138/83  (!) 154/88 (!) 156/87  Pulse: 86  83 93  Resp: 15  15 17   Temp: 99 F (37.2 C)  97.8 F (36.6 C) 98.7 F (37.1 C)  TempSrc: Oral  Oral Oral  SpO2: 98%  100% 99%  Weight:  104.8 kg    Height:         Intake/Output Summary (Last 24 hours) at 09/23/2021 1238 Last data filed at 09/23/2021 0800 Gross per 24 hour  Intake 2260 ml  Output 4300 ml  Net -2040 ml      Recent Labs    09/20/21 1245 09/21/21 0429  WBC 9.7 16.4*  HGB 13.1 11.9*  HCT 38.7 36.7  PLT 221 210    Blood type: --/--/O POS (02/03 1147)  Rubella: Immune (10/14 0000)   Physical Exam:  General: alert and cooperative CV: Regular rate and rhythm Resp: clear Abdomen: soft, nontender, normal bowel sounds Incision: clean, dry, and intact Uterine Fundus: firm, below umbilicus, nontender Lochia: minimal Ext: edema 1+ non-pitting to lower extremities  Assessment/Plan: 46 y.o.   POD# 3. [id number]                  Principal Problem:   Postpartum care following cesarean delivery 2/3  Encourage to ambulate Routine post-op care Active Problems:   IUGR (intrauterine growth restriction) affecting care of  mother   Severe preeclampsia  Stopped PO Labetalol after delivery for BP WNL, will restart today 200mg  TID after consult with Dr. Pamala Hurry  Continue Procardia XL 60mg  BID  Continue Lasix 20mg  today for last dose  BPs mild range today  Labs stable  Denies PEC symptoms   Status post primary low transverse cesarean section 2/3   Will assess afternoon BPs after restarting Labetalol. Consider discharge home this evening if BPs well controlled. Plan close PP F/U for BP in office.   Dr. Pamala Hurry updated on patient status.   Suzan Nailer, CNM, MSN 09/23/2021, 12:38 PM

## 2021-09-24 LAB — SURGICAL PATHOLOGY

## 2021-09-24 NOTE — Discharge Summary (Addendum)
Postpartum Discharge Summary  Date of Service updated     Patient Name: Brianna Lucero DOB: 21-Feb-1976 MRN: [id number]  Date of admission: 08/29/2021 Delivery date:09/20/2021  Delivering provider: MODY, VAISHALI  Date of discharge: 09/23/2021  Admitting diagnosis: IUGR (intrauterine growth restriction) affecting care of mother [O36.5990] Hypertension in pregnancy, preeclampsia, severe, delivered/postpartum [O14.15] Intrauterine pregnancy: [redacted]w[redacted]d     Secondary diagnosis:  Principal Problem:   Postpartum care following cesarean delivery 2/3 Active Problems:   IUGR (intrauterine growth restriction) affecting care of mother   Severe preeclampsia   Status post primary low transverse cesarean section 2/3   Chronic hypertension   Hypertension in pregnancy, preeclampsia, severe, delivered/postpartum  Additional problems: none    Discharge diagnosis: Preterm Pregnancy Delivered, Preeclampsia (severe), and CHTN                                              Post partum procedures: none Augmentation: N/A Complications: None; myomectomy at time of classical c/s d/t location  Hospital course: The patient was admitted on 08/29/21 at23.5 wga d/t worsening bps with h/o CHTN and also IUGR with elevated UAD.  Her bps were managed and controlled with oral meds, pt remained inpt for fetal monitoring and followed with bid dopplers.  BMZ given and then patient given rescue dose steroids when Worsening dopplers were noted. MFM also closely following pt, dopplers were not requiring delivery.  Last growth u/s showed sever iugr. Patient then had severe range bps worsening and requiring IV medications.  The patient was then taken to OR for chtn with superimposed pre-eclampsia.  Patient was given magnesium sulfate for 5 hours before delivery for neuroprotection.  The patient then underwent a classical c/s with myomectomy about 6cm which was preventing delivery of fetus.  The patient underwent post op magnesium and  was continued on procardia.  Today pt with mild range bps and started on labetalol tid.  Bps not severe and stable, will plan d/c home today with close f/u.  Patient wanting d/c, tearful, reliable and will closely monitor home bps.  2 day office f/u.    Magnesium Sulfate received: Yes: Neuroprotection and seizure prophylaxis BMZ received: Yes Rhophylac:No  Physical exam  Vitals:   09/23/21 0441 09/23/21 0804 09/23/21 1200 09/23/21 1638  BP:  (!) 154/88 (!) 156/87 (!) 152/87  Pulse:  83 93 89  Resp:  15 17 16   Temp:  97.8 F (36.6 C) 98.7 F (37.1 C) 98.5 F (36.9 C)  TempSrc:  Oral Oral Oral  SpO2:  100% 99% 99%  Weight: 104.8 kg     Height:       Labs: Lab Results  Component Value Date   WBC 16.4 (H) 09/21/2021   HGB 11.9 (L) 09/21/2021   HCT 36.7 09/21/2021   MCV 85.7 09/21/2021   PLT 210 09/21/2021   CMP Latest Ref Rng & Units 09/22/2021  Glucose 70 - 99 mg/dL 89  BUN 6 - 20 mg/dL 13  Creatinine 0.44 - 1.00 mg/dL 0.95  Sodium 135 - 145 mmol/L 135  Potassium 3.5 - 5.1 mmol/L 4.0  Chloride 98 - 111 mmol/L 104  CO2 22 - 32 mmol/L 23  Calcium 8.9 - 10.3 mg/dL 7.9(L)  Total Protein 6.5 - 8.1 g/dL 4.9(L)  Total Bilirubin 0.3 - 1.2 mg/dL 0.2(L)  Alkaline Phos 38 - 126 U/L 59  AST 15 -  41 U/L 22  ALT 0 - 44 U/L 28   Edinburgh Score: Edinburgh Postnatal Depression Scale Screening Tool 09/22/2021  I have been able to laugh and see the funny side of things. 0  I have looked forward with enjoyment to things. 0  I have blamed myself unnecessarily when things went wrong. 1  I have been anxious or worried for no good reason. 3  I have felt scared or panicky for no good reason. 3  Things have been getting on top of me. 2  I have been so unhappy that I have had difficulty sleeping. 1  I have felt sad or miserable. 2  I have been so unhappy that I have been crying. 1  The thought of harming myself has occurred to me. 0  Edinburgh Postnatal Depression Scale Total 13       After visit meds:  Allergies as of 09/23/2021   No Known Allergies      Medication List     TAKE these medications    acetaminophen 325 MG tablet Commonly known as: TYLENOL Take 2 tablets (650 mg total) by mouth every 4 (four) hours as needed for mild pain (temperature > 101.5.).   acyclovir 400 MG tablet Commonly known as: ZOVIRAX Take 1 tablet (400 mg total) by mouth 2 (two) times daily.   cholecalciferol 25 MCG (1000 UNIT) tablet Commonly known as: VITAMIN D3 Take 1,000 Units by mouth daily.   furosemide 20 MG tablet Commonly known as: LASIX Take 1 tablet (20 mg total) by mouth daily for 1 day.   ibuprofen 600 MG tablet Commonly known as: ADVIL Take 1 tablet (600 mg total) by mouth every 6 (six) hours.   labetalol 200 MG tablet Commonly known as: NORMODYNE Take 1 tablet (200 mg total) by mouth 3 (three) times daily.   loratadine 10 MG tablet Commonly known as: CLARITIN Take 10 mg by mouth daily.   NIFEdipine 60 MG 24 hr tablet Commonly known as: ADALAT CC Take 1 tablet (60 mg total) by mouth every 12 (twelve) hours.   oxyCODONE 5 MG immediate release tablet Commonly known as: Oxy IR/ROXICODONE Take 1 tablet (5 mg total) by mouth every 4 (four) hours as needed for moderate pain.   prenatal multivitamin Tabs tablet Take 1 tablet by mouth daily at 12 noon.         Discharge home in stable condition Infant Feeding: in NICU Infant Disposition:NICU Discharge instruction: per After Visit Summary and Postpartum booklet. Activity: Advance as tolerated. Pelvic rest for 6 weeks.  Diet: routine diet Anticipated Birth Control: Unsure Postpartum Appointment: 2 days Additional Postpartum F/U: BP check 2 days Future Appointments:No future appointments. Follow up Visit:  Follow-up Information     Azucena Fallen, MD. Schedule an appointment as soon as possible for a visit in 3 day(s).   Specialty: Obstetrics and Gynecology Why: For blood pressure  check. Contact information: [place] [address] [phone]                     09/24/2021 Charyl Bigger, MD

## 2021-09-27 ENCOUNTER — Ambulatory Visit: Payer: Self-pay

## 2021-09-27 NOTE — Lactation Note (Signed)
This note was copied from a baby's chart. Lactation Consultation Note  Patient Name: Brianna Lucero [id number] Date: 09/27/2021 Reason for consult: Follow-up assessment;1st time breastfeeding;Primapara;NICU baby;Preterm <34wks;Other (Comment);Infant < 6lbs (IUGR, IVF (donor egg)) Age:45 days  Visited with mom of 72 22/4 weeks old (adjusted) NICU female, she's a P1 and reports that her milk  hasn't come in yet at day 7 post-partum. She voiced that she went to her OBGYN and got an Rx for Renglan for 10 days.   She was pumping when entered the room, LC assisted with pumping and the collection of 1 ml, labeled both bottles, she had one she brought from home. Revised pumping plan, she's been pumping on expression mode for + 30 minutes getting minimal results. She voiced she'd like to keep trying, will continue to monitor while on Reglan.  Maternal Data  Mom's supply has not increased and stays BNL possibly due to risk factors such as P1, AMA and hormonal imbalances prior the pregnancy. No s/s of retained placenta, mom voiced that she stopped bleeding soon after she left the hospital.  Feeding Mother's Current Feeding Choice: Breast Milk and Donor Milk  Lactation Tools Discussed/Used Tools: Pump;Flanges;Coconut oil Flange Size: 24 Breast pump type: Double-Electric Breast Pump Pump Education: Setup, frequency, and cleaning;Milk Storage Reason for Pumping: pre-term in NICU Pumping frequency: 8 times/24 hours Pumped volume: 1 mL  Interventions Interventions: Breast feeding basics reviewed;DEBP;Education  Plan of care Encouraged mom to continue pumping every 3 hours, at least 8 pumping sessions/24 hours Hand expression, breast massage and coconut oil were also encouraged prior pumping She'll finish up with the 10 days course of Reglan   FOB present and supportive. All questions and concerns answered, parents to call NICU LC PRN.  Discharge Pump: DEBP;Stork Pump  Consult Status Consult  Status: Follow-up Date: 09/27/21 Follow-up type: In-patient   Jishnu Jenniges Francene Boyers 09/27/2021, 2:39 PM

## 2021-10-01 ENCOUNTER — Ambulatory Visit: Payer: Self-pay

## 2021-10-01 ENCOUNTER — Telehealth (HOSPITAL_COMMUNITY): Payer: Self-pay | Admitting: *Deleted

## 2021-10-01 NOTE — Telephone Encounter (Signed)
Return call placed to patient. EPDS=5. Patient requested RN email information on hospital's virtual postpartum classes and support groups. Email sent. Erline Levine, RN, 10/01/21, [phone]

## 2021-10-01 NOTE — Lactation Note (Signed)
This note was copied from a baby's chart.  NICU Lactation Consultation Note  Patient Name: Girl Raynesha Tiedt ELYHT'M Date: 10/01/2021 Age:45 days   Subjective Reason for consult: Follow-up assessment; Weekly NICU follow-up Mother is on day 5 of 10-day Reglan Rx. She is also taking Fenugreek. Mother pumps frequently but has not experienced onset of copious milk at this time.   Objective Infant data: Mother's Current Feeding Choice: Breast Milk and Donor Milk  Infant feeding assessment Scale for Readiness: 5    Maternal data: [id number]  C-Section, Classical  Current breast feeding challenges:: low milk supply at day 11 pp  Pumping frequency: pumping q3: drops on day 11 Flange Size: 27   Pump: DEBP, Stork Pump  Assessment Maternal: Milk volume: Low   Intervention/Plan Interventions: Education (observed pumping, heat packs, flange size adjustment)  Tools: Flanges  Plan: Consult Status: Follow-up  NICU Follow-up type: Verify onset of copious milk  Mother to increase to 8-10 15-minute pumping sessions.   Gwynne Edinger 10/01/2021, 12:54 PM

## 2021-10-01 NOTE — Telephone Encounter (Signed)
Attempted hospital discharge follow-up call. Patient voiced no questions or concerns regarding her own health at this time. Requested RN call back later this evening to complete EPDS. Erline Levine, RN, 10/01/21, [phone]

## 2021-10-09 ENCOUNTER — Ambulatory Visit: Payer: Self-pay

## 2021-10-09 NOTE — Lactation Note (Signed)
This note was copied from a baby's chart. Lactation Consultation Note  Patient Name: Girl Tanyia Grabbe [id number] Date: 10/09/2021 Reason for consult: Other (Comment);Primapara;1st time breastfeeding;NICU baby;Follow-up assessment;Infant < 6lbs;Preterm <34wks (AMA, IVF (donor egg)) Age:45 wk.o.  Visited with mom of 30 36/15 weeks old (adjusted) NICU female, she's a P1 and reports she has finished her 10 day treatment with Reglan two days ago but still hasn't seen an increase in her supply.  Reviewed options with mom, let her know that due to delayed lactogenesis II her milk most likely won't come in. Mom chooses to keep providing her EBM to her baby, let her know that NICU [facility] services will continue following up with her on a weekly basis.  Maternal Data  Mom's supply is BNL  Feeding Mother's Current Feeding Choice: Breast Milk and Donor Milk  Lactation Tools Discussed/Used Tools: Pump;Flanges Flange Size: 27 Breast pump type: Double-Electric Breast Pump Pump Education: Setup, frequency, and cleaning;Milk Storage Reason for Pumping: pre-term in NICU Pumping frequency: 7 times/24 hours Pumped volume: 2 mL (2-3 ml)  Interventions Interventions: Education  Plan of care Encouraged mom to continue pumping every 3 hours, or at her own pace She'll start using her EBM to provide oral care for baby instead of leaving it for the milk bank. Will ask NICU RN for assistance with oral care   FOB present and supportive. All questions and concerns answered, parents to call NICU LC PRN.  Discharge Pump: DEBP;Stork Pump  Consult Status Consult Status: Follow-up Date: 10/09/21 Follow-up type: In-patient   Daneya Hartgrove Francene Boyers 10/09/2021, 5:26 PM

## 2021-10-18 ENCOUNTER — Ambulatory Visit: Payer: Self-pay

## 2021-10-18 NOTE — Lactation Note (Signed)
This note was copied from a baby's chart. ?Lactation Consultation Note ? ?Patient Name: Brianna Lucero ?Today's Date: 10/18/2021 ?Reason for consult: Other (Comment);Follow-up assessment;NICU baby;Primapara;1st time breastfeeding;Infant < 6lbs;Preterm <34wks (AMA, IVF (donor egg)) ?Age:45 wk.o. ? ?Visited with mom of 23 72/68 weeks old (adjusted), she's a P1 and reports her milk volumes are still the same at 4 weeks post-partum. Mom has slowed down a bit on her pumping but she voiced she would still like to work on it.  ? ?Baby is back on continuous feeds, mom doing STS with baby when entered the room, praised her for her efforts. Reviewed benefits of STS care. She voiced she hasn't had the change to do oral care with baby, she just got taken off her CPAP but the next time she comes in, she'll ask for assistance to do oral care with her EBM if baby is awake. ? ?Feeding ?Mother's Current Feeding Choice: Breast Milk and Donor Milk ? ?Lactation Tools Discussed/Used ?Tools: Pump;Flanges ?Flange Size: 27 ?Breast pump type: Double-Electric Breast Pump ?Pump Education: Setup, frequency, and cleaning;Milk Storage ?Reason for Pumping: pre-term in NICU ?Pumping frequency: 4 times/24 hours ?Pumped volume: 2 mL (2-3 ml) ? ?Interventions ?Interventions: Education ? ?Plan of care ?Encouraged mom to continue pumping at her own pace ?She'll start using her EBM to provide oral care for baby. Will ask NICU RN for assistance with oral care ?  ?No other support person at this time. All questions and concerns answered, parents to call NICU LC PRN. ? ?Discharge ?Pump: DEBP;Stork Pump ? ?Consult Status ?Consult Status: Follow-up ?Date: 10/18/21 ?Follow-up type: In-patient ? ? ?Rae Plotner S Tanveer Brammer ?10/18/2021, 1:43 PM ? ? ? ?

## 2021-11-02 ENCOUNTER — Ambulatory Visit: Payer: Self-pay

## 2021-11-02 NOTE — Lactation Note (Signed)
This note was copied from a baby's chart. ?Lactation Consultation Note ? ?Patient Name: Brianna Lucero ?Today's Date: 11/02/2021 ?Reason for consult: Other (Comment);Follow-up assessment;NICU baby;Primapara;1st time breastfeeding;Infant < 6lbs;Preterm <34wks (telephone call) ?Age:45 wk.o. ? ?TELEPHONE CALL ? ?Attempt to visit with mom multiple times but she didn't come to the unit today. LC spoke to mom over the phone and she voiced that she's still pumping but with minimal output, she's thinking about stopping. Let mom know that NICU [facility] services will be available the next time she comes to visit baby to F/U in person.  ? ?Feeding ?Mother's Current Feeding Choice: Breast Milk and Formula ? ?Lactation Tools Discussed/Used ?Tools: Pump;Flanges ?Flange Size: 27 ?Breast pump type: Double-Electric Breast Pump ?Pump Education: Setup, frequency, and cleaning;Milk Storage ?Reason for Pumping: pre-term in NICU ? ?Discharge ?Pump: DEBP;Stork Pump ? ?Consult Status ?Consult Status: Follow-up ?Date: 11/02/21 ?Follow-up type: In-patient ? ? ?Cloyce Blankenhorn S Tylesha Gibeault ?11/02/2021, 6:01 PM ? ? ? ?

## 2021-11-04 ENCOUNTER — Ambulatory Visit: Payer: Self-pay

## 2021-11-04 NOTE — Lactation Note (Signed)
This note was copied from a baby'Lucero chart. ?Lactation Consultation Note ? ?Patient Name: Brianna Lucero ?Today'Lucero Date: 11/04/2021 ?Reason for consult: Follow-up assessment;Primapara;1st time breastfeeding;NICU baby;Infant < 6lbs;Preterm <34wks ?Age:45 wk.o. ? ?Visited with mom of 2 59/77 weeks old (adjusted) NICU female, she'Lucero a P1 and has slowed down on her pumping, mom voiced she'Lucero still getting a bit of milk when she pump but that she hasn't been bringing it to the hospital the last week. Brianna Lucero will be going back to work tomorrow, encouraged her to bring any amount of EBM she may have to use it directly for baby'Lucero oral care. ? ?Brianna Lucero assisted with finger feeding, baby took 4 drops of Similac 27 calorie formula. Her sucking patter is still immature with some sucking/bitting but she'Lucero showing emerging feeding cues. Brianna Lucero voiced she'Lucero been taking her paci for the last two weeks but today was the first time where she put her hands onto her mouth and started sucking on her fingers, right after [place] assisted with finger feeding. Mom and baby doing STS during [facility] consultation, praised her for her efforts. ? ?Feeding ?Mother'Lucero Current Feeding Choice: Breast Milk and Formula ? ?Lactation Tools Discussed/Used ?Tools: Pump;Flanges ?Flange Size: 27 ?Breast pump type: Double-Electric Breast Pump ?Pump Education: Setup, frequency, and cleaning;Milk Storage ?Reason for Pumping: pre-term in NICU ?Pumping frequency: 2 times/24 hours ?Pumped volume:  (drops) ? ?Interventions ?Interventions: Skin to skin;DEBP;Education ? ?Plan of care ?Encouraged mom to continue pumping at her own pace; she'll notify lactation if/when she decides to stop ?She'll start using her EBM/formula for pre-feeding activities such as finger feeding and pacifier dips at feeding times ?STS care was also encouraged during feeding times ?  ?FOB present and very supportive. All questions and concerns answered, parents to call NICU LC  PRN. ? ?Discharge ?Pump: DEBP;Stork Pump ? ?Consult Status ?Consult Status: Follow-up ?Date: 11/04/21 ?Follow-up type: In-patient ? ? ?Brianna Lucero Brianna Lucero ?11/04/2021, 2:03 PM ? ? ? ?

## 2021-11-11 ENCOUNTER — Ambulatory Visit: Payer: Self-pay

## 2021-11-11 NOTE — Lactation Note (Signed)
This note was copied from a baby's chart. ?Lactation Consultation Note ? ?Patient Name: Brianna Lucero ?Today's Date: 11/11/2021 ?Reason for consult: Follow-up assessment;1st time breastfeeding;Primapara;NICU baby;Infant < 6lbs;Late-preterm 34-36.6wks ?Age:45 wk.o. ? ?Visited with mom of 52 74/73 weeks old (adjusted) NICU female, Brianna Lucero reports she's still pumping and getting some drops but she has decreased pumping frequency. SLP Abran Richard working with mom prior consult for lick & learn, mom was excited to report that baby has been cueing for the past week, she really likes her paci; she's been working on some pre-feeding activities and STS care.  ? ?Mom wishes to take baby to breast for comfort, revised lick & learn and encouraged her to continue doing as much STS care as possible. Brianna Lucero still doing STS with baby "Brianna Lucero" when exiting the room, praised her for her efforts. ? ?Feeding ?Mother's Current Feeding Choice: Breast Milk and Formula ? ?Lactation Tools Discussed/Used ?Tools: Pump;Flanges ?Flange Size: 27 ?Breast pump type: Double-Electric Breast Pump ?Pump Education: Setup, frequency, and cleaning;Milk Storage ?Reason for Pumping: LPI in NICU ?Pumping frequency: every 2 days ?Pumped volume:  (drops) ? ?Interventions ?Interventions: Skin to skin;DEBP;Education ? ?Plan of care ?Encouraged mom to continue pumping at her own pace; she'll notify lactation if/when she decides to stop ?She'll start using her EBM/formula for pre-feeding activities such as finger feeding and pacifier dips at feeding times ?STS care continued to be encouraged during feeding times ?  ?FOB present and very supportive. All questions and concerns answered, parents to call NICU LC PRN. ? ?Discharge ?Pump: DEBP;Stork Pump ? ?Consult Status ?Consult Status: Follow-up ?Date: 11/11/21 ?Follow-up type: In-patient ? ? ?Brianna Lucero ?11/11/2021, 4:30 PM ? ? ? ?

## 2021-11-22 ENCOUNTER — Ambulatory Visit: Payer: Self-pay

## 2021-11-22 NOTE — Lactation Note (Signed)
This note was copied from a baby's chart. ?Lactation Consultation Note ?Mother is no longer pumping. She would like [place] assistance with comfort nursing on Sunday at 12pm feeding time. [facility] team will plan return visit to assist.  ? ?Patient Name: Brianna Lucero ?Today's Date: 11/22/2021 ?  ?Age:45 m.o. ? ? ?Gwynne Edinger ?11/22/2021, 11:08 AM ? ? ? ?

## 2021-11-24 ENCOUNTER — Ambulatory Visit: Payer: Self-pay

## 2021-11-24 NOTE — Lactation Note (Signed)
This note was copied from a baby's chart. ? ?NICU Lactation Consultation Note ? ?Patient Name: Brianna Lucero ?Today's Date: 11/24/2021 ?Age:45 m.o. ? ? ?Subjective ?Reason for consult: Follow-up assessment; Mother's request; Primapara; 1st time breastfeeding; NICU baby; Preterm <34wks ? ?Ms. Araceli Bouche requested lactation to assist with latching baby Brianna Lucero to the breast. Ms. Schermer states that she is no longer pumping; she stopped about a week ago. She is interested in comfort nursing Brianna Lucero. ? ?We allowed baby to receive feeding via gavage during this attempt. I assisted with placing baby to the right breast in cross cradle hold. Baby was alert and showed good feeding cues, such as rooting, open mouth on breast, and licking. She was unable to establish a latch, but she did attempt. ? ?She did become fatigued, and I encouraged Ms. Grant to hold her near the breast. ? ?Ms. Shrider reports that Brianna Lucero's cues are stronger today. They asked questions about her next steps with PO feeding. I offered to contact the SLP to set up a follow up appointment. They are interested in seeing someone on 4/10 at noon. Brianna Lucero may benefit from a nipple shield at the breast. We discussed this briefly.  ? ?Objective ?Infant data: ?Mother's Current Feeding Choice: Formula ? ?Infant feeding assessment ?Scale for Readiness: 2 (briefly took pacifier) ? ?Maternal data: ?[id number]  ?C-Section, Classical ? ?Current breast feeding challenges:: No longer pumping; NICU ? ? ?Does the patient have breastfeeding experience prior to this delivery?: No ? ?Pumping frequency: No longer pumping ? ?Risk factor for low milk supply:: High ? ? ?Pump: DEBP, Stork Pump ? ?Assessment ?Infant: ?LATCH Score: 5 ? ? ?Intervention/Plan ?Interventions: Breast feeding basics reviewed; Assisted with latch; Skin to skin; Hand express; Education ? ?Plan: ?Consult Status: Follow-up ? ?NICU Follow-up type: Weekly NICU follow up; Assist with IDF-1 (Mother to pre-pump before  breastfeeding) ? ? ? ?Lenore Manner ?11/24/2021, 12:27 PM ?

## 2021-11-25 ENCOUNTER — Ambulatory Visit: Payer: Self-pay

## 2021-11-25 NOTE — Lactation Note (Signed)
This note was copied from a baby's chart. ? ?NICU Lactation Consultation Note ? ?Patient Name: Brianna Lucero ?Today's Date: 11/25/2021 ?Age:46 years ? ? ?Subjective ?Reason for consult: Follow-up assessment; Mother's request; Primapara; 1st time breastfeeding; NICU baby; Preterm <34wks ? ?I followed up with Brianna Lucero. We placed Brianna Lucero STS. I demonstrated how to place a size 20 NS (dipped in ABM) onto the breast. Baby was quiet alert at this time. She attempted to latch. However, the NS appeared to be too small. I then found a size 16 NS. At this time, we placed Brianna Lucero upright on Brianna Lucero's chest, and we discontinued the attempt as she was showing some stress cues (hiccups). ? ?SLP to follow up today to assess baby. I encouraged her to use the size 16 NS with attempts as Brianna Lucero shows readiness. ? ? ?Objective ?Infant data: ?Mother's Current Feeding Choice: Formula ? ?Infant feeding assessment ?Scale for Readiness: 3 ? ?Maternal data: ?[id number]  ?C-Section, Classical ?Current breast feeding challenges:: NICU; ? ?Does the patient have breastfeeding experience prior to this delivery?: No ? ?Pumping frequency: No longer pumping ? ?Risk factor for low milk supply:: High ? ? ?Pump: DEBP, Stork Pump ? ?Assessment ?Infant: ?LATCH Score: 5 ? ? ?Intervention/Plan ?Interventions: Breast feeding basics reviewed; Education; Skin to skin; Adjust position; Support pillows ? ?Plan: ?Consult Status: Follow-up ? ?NICU Follow-up type: Weekly NICU follow up; Assist with IDF-1 (Mother to pre-pump before breastfeeding) ? ? ? ?Lenore Manner ?11/25/2021, 12:21 PM ?

## 2021-12-05 ENCOUNTER — Ambulatory Visit: Payer: Self-pay

## 2021-12-05 NOTE — Lactation Note (Signed)
This note was copied from a baby's chart. ? ?NICU Lactation Consultation Note ? ?Patient Name: Brianna Lucero ?Today's Date: 12/05/2021 ?Age:45 m.o. ? ? ?Subjective ?Reason for consult: Follow-up assessment ?Mom is no longer pumping. Her milk has dried. She is practicing comfort nursing although infant has not demonstrated interest yet.  ? ?Objective ?Infant data: ?Mother's Current Feeding Choice: Formula ? ?Infant feeding assessment ?Scale for Readiness: 3 ?Scale for Quality: 4 ? ?  ?Maternal data: ?[id number]  ?C-Section, Classical ?Pumping frequency: no longer pumping ? ?Pump: DEBP, Stork Pump ? ? ?Intervention/Plan ?Interventions: Education ? ?Plan: ?Consult Status: Follow-up ? ?NICU Follow-up type: Weekly NICU follow up ? ?[place] team will continue to support mom and baby with comfort nursing. ? ? ?Gwynne Edinger ?12/05/2021, 3:41 PM ?

## 2021-12-10 ENCOUNTER — Ambulatory Visit: Payer: Self-pay

## 2021-12-10 NOTE — Lactation Note (Signed)
This note was copied from a baby's chart. ? ?NICU Lactation Consultation Note ? ?Patient Name: Brianna Lucero ?Today's Date: 12/10/2021 ?Age:45 m.o. ? ? ?Subjective ?Reason for consult: Follow-up assessment ?Mother has continued interest in comfort nursing. We reviewed. She is aware of LC support prn. She is no longer pumping. ? ?Objective ?Infant data: ?Mother's Current Feeding Choice: Formula ? ?Infant feeding assessment ?Scale for Readiness: 2 ?Scale for Quality: 2 ? ? ?  ?Maternal data: ?[id number]  ?C-Section, Classical ? ?Pump: DEBP, Stork Pump ? ?Assessment ?Maternal: ? ?Intervention/Plan ?Interventions: Education ? ? ?Plan: ?Consult Status: Follow-up ? ?Will plan f/u to assist with latch prn. ? ? ?Gwynne Edinger ?12/10/2021, 4:18 PM ?

## 2021-12-17 ENCOUNTER — Ambulatory Visit: Payer: Self-pay

## 2021-12-17 NOTE — Lactation Note (Signed)
This note was copied from a baby's chart. ? ?NICU Lactation Consultation Note ? ?Patient Name: Brianna Lucero ?Today's Date: 12/17/2021 ?Age:45 m.o. ? ?Subjective ?Reason for consult: Follow-up assessment; NICU baby; Primapara; 1st time breastfeeding; Other (Comment); Term (AMA) ? ?Visited with mom of 53 27/75 weeks old (adjusted) NICU female, Brianna Lucero reports baby was switched to an ad lib status yesterday and that she's been drinking most of her PO feeding, new formula Enfamil A.R 24 cal seems to be working. Asked Brianna Lucero if she would like to have assistance for comfort nursing but she politely declined and said she already had too much on her plate right now. She'll continue doing STS and cuddle with baby "Brianna Lucero" and might try comfort nursing on her own. All questions and concerns answered, [place] services are completed at this time. ? ?Objective ?Infant data: ?Mother's Current Feeding Choice: Formula ? ?Infant feeding assessment ?Scale for Readiness: 2 ?Scale for Quality: 2 ? ?Maternal data: ?[id number]  ?Pump: DEBP, Stork Pump ? ?Assessment ?Maternal: ?Mom is no longer pumping ? ?Intervention/Plan ?Interventions: Education ?FOB present. Family aware to call for [facility] services PRN. ? ?Consult Status: Complete ? ? ?Lyanna Blystone S Chelsea Pedretti ?12/17/2021, 4:56 PM ?

## 2022-03-14 DIAGNOSIS — J302 Other seasonal allergic rhinitis: Secondary | ICD-10-CM | POA: Insufficient documentation

## 2022-03-14 DIAGNOSIS — F325 Major depressive disorder, single episode, in full remission: Secondary | ICD-10-CM | POA: Insufficient documentation

## 2022-03-14 DIAGNOSIS — D72819 Decreased white blood cell count, unspecified: Secondary | ICD-10-CM | POA: Insufficient documentation

## 2023-01-09 IMAGING — US US MFM UA CORD DOPPLER
1 series · 14 of 28 positions shown · non-contrast
Comparison: none

[Series 1: us mfm ua cord doppler · 14 of 63 slices shown]
[im 3/63]
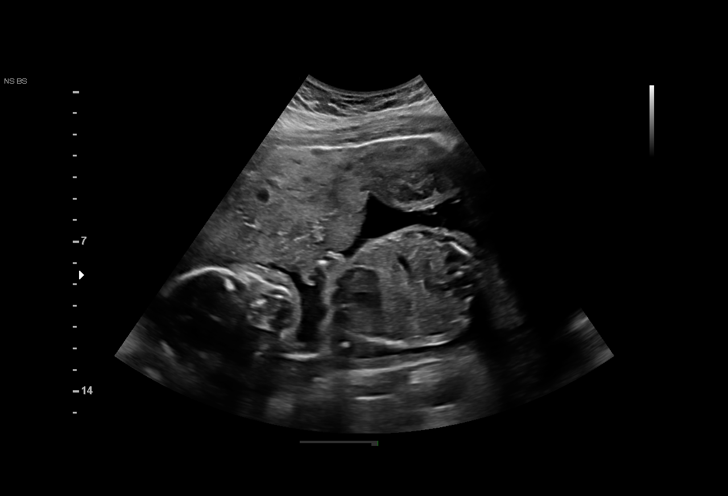
[im 7/63]
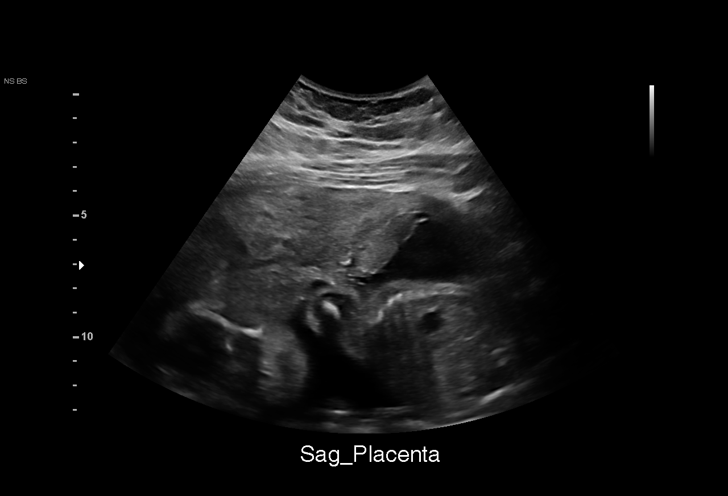
[im 12/63]
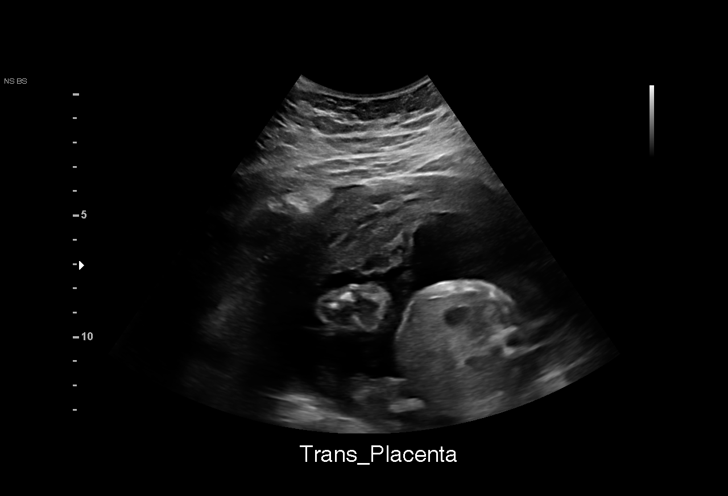
[im 17/63]
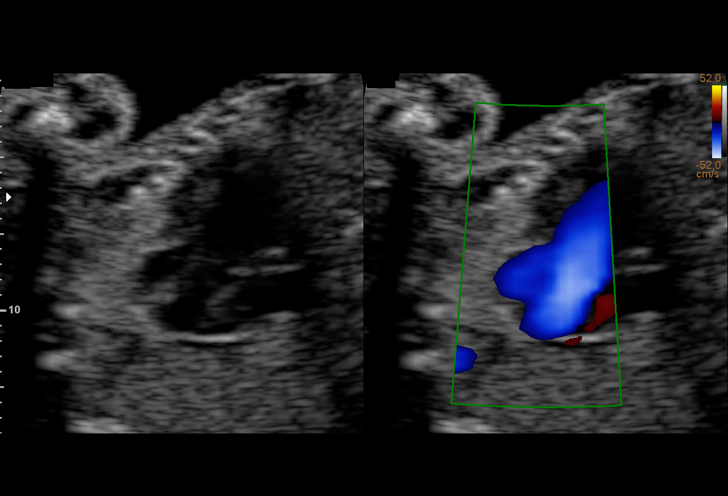
[im 21/63]
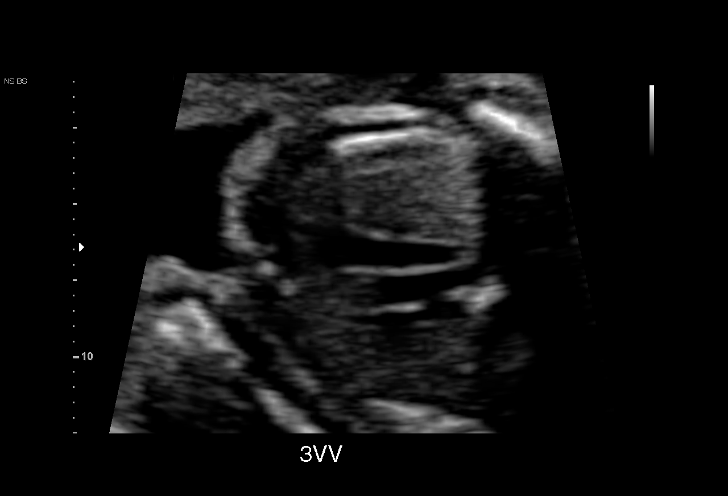
[im 26/63]
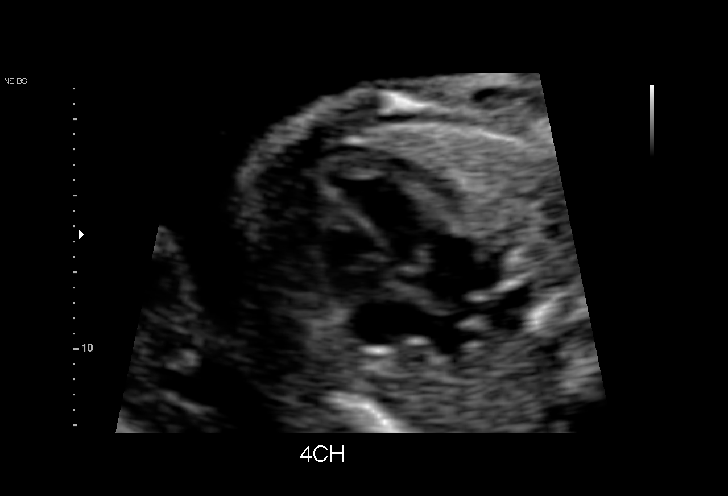
[im 30/63]
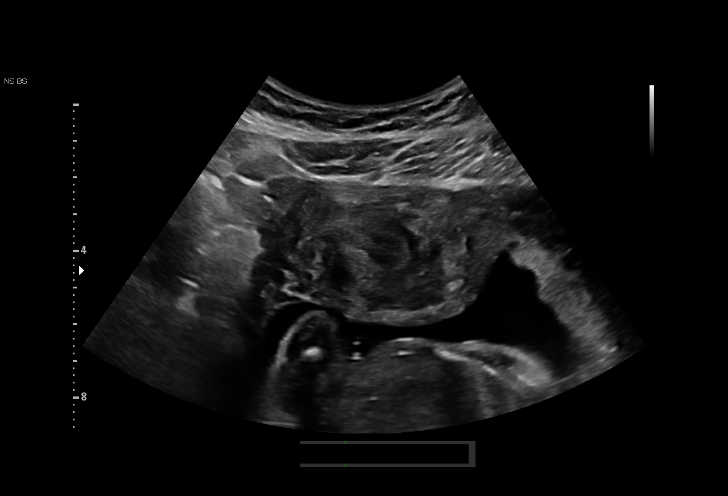
[im 35/63]
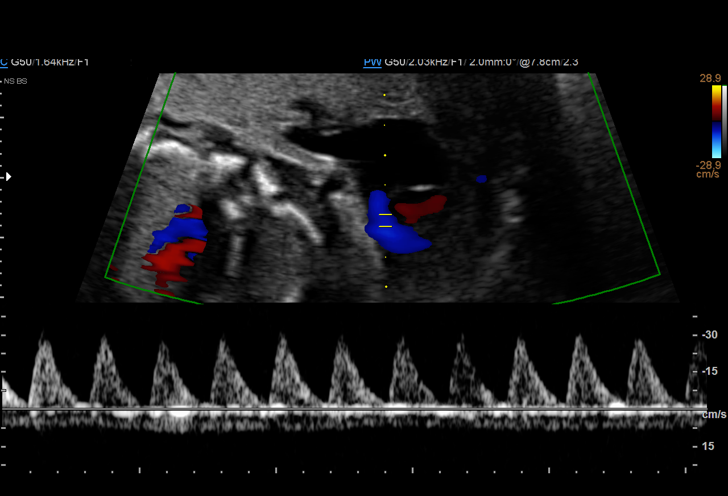
[im 40/63]
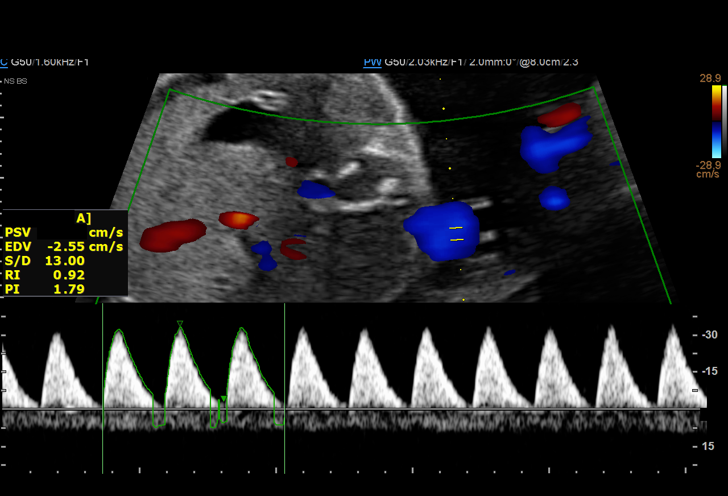
[im 44/63]
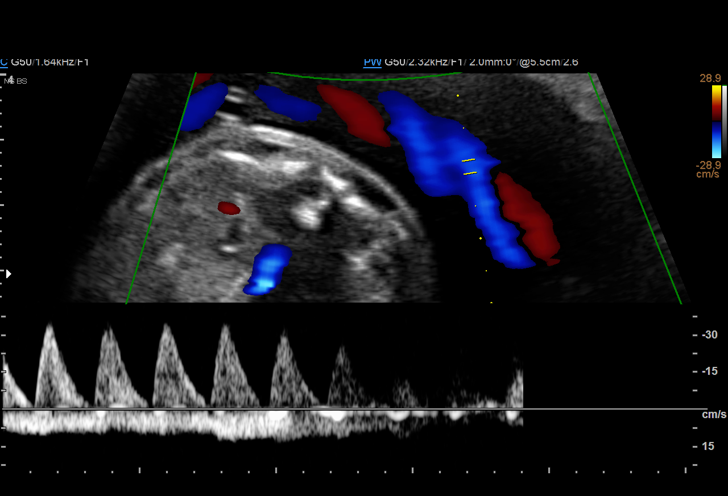
[im 49/63]
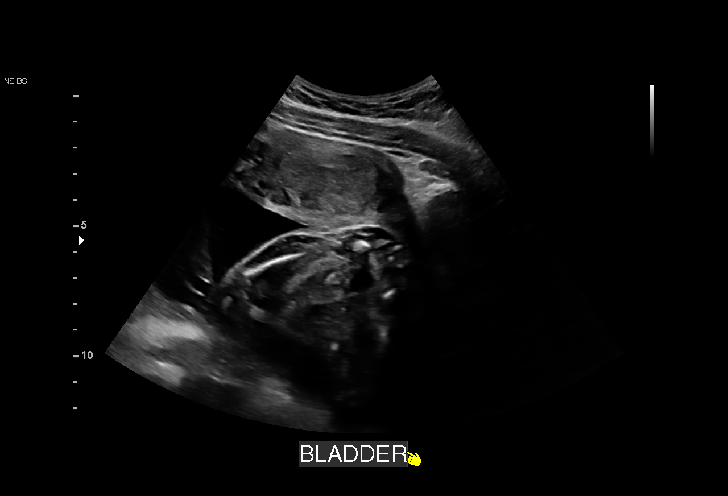
[im 53/63]
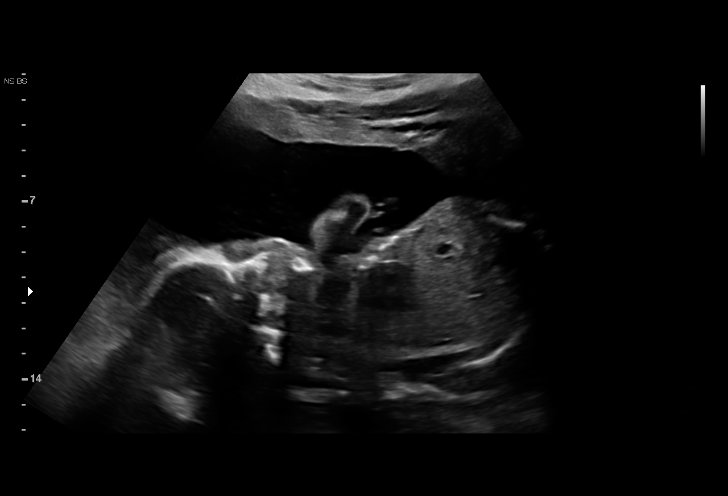
[im 58/63]
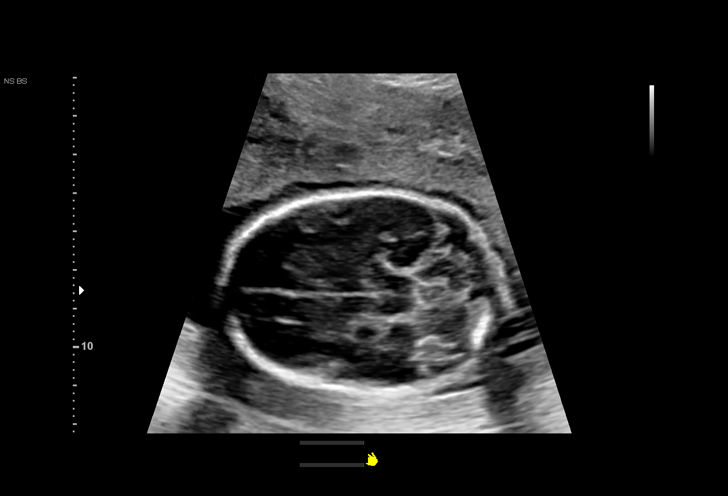
[im 63/63]
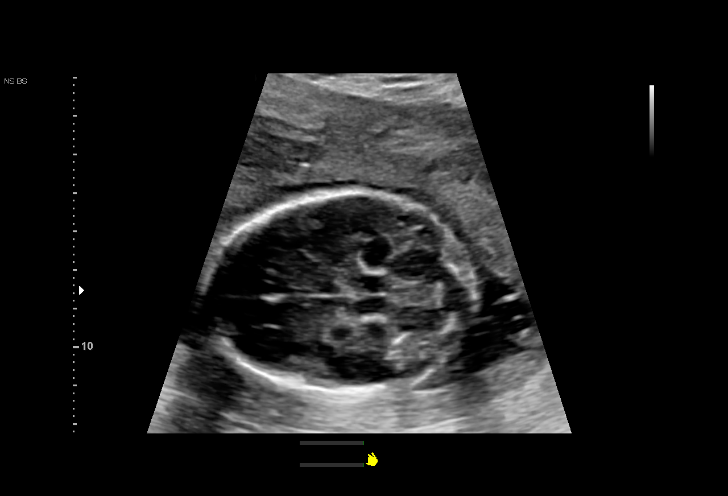

[14 of 28 positions shown; findings below may reference images not displayed]

Attending:        Yohsuke Noda      Secondary Phy.:    [facility]
                                                             [facility]
                                                             Infertility
                                                             [id number] [HOSPITAL]
                   Care                                      [HOSPITAL]

 1  US MFM UA CORD DOPPLER                76820.02    [facility]
 2  US MFM OB LIMITED                     76815.01    [facility]

Indications

 Maternal care for known or suspected poor
 fetal growth, second trimester, not applicable
 or unspecified IUGR
 24 weeks gestation of pregnancy
 Pregnancy resulting from assisted
 reproductive technology (IVF - egg donor)
 Advanced maternal age multigravida 35+,
 second trimester
 Hypertension - Chronic/Pre-existing
 (Labetalol)
 Encounter for antenatal screening for
 malformations
 Uterine fibroids affecting pregnancy in         O34.12,
 second trimester, antepartum
 Obesity complicating pregnancy, second
 trimester (BMI 37)
Fetal Evaluation

 Num Of Fetuses:          1
 Fetal Heart Rate(bpm):   135
 Cardiac Activity:        Observed
 Presentation:            Breech
 Placenta:                Anterior
 P. Cord Insertion:       Previously Visualized
 Amniotic Fluid
 AFI FV:      Within normal limits

                             Largest Pocket(cm)

OB History

 Gravidity:    3         Term:   0        Prem:   0        SAB:   2
 TOP:          0       Ectopic:  0        Living: 0
Gestational Age

 Best:          24w 3d     Det. By:  Embryo Transfer          EDD:   12/20/21
Anatomy

 Cranium:               Appears normal         Heart:                  Appears normal
                                                                       (4CH, axis, and
                                                                       situs)
 Cavum:                 Appears normal         LVOT:                   Appears normal
 Ventricles:            Appears normal         Stomach:                Appears normal, left
                                                                       sided
 Choroid Plexus:        Appears normal         Kidneys:                Appear normal
 Cerebellum:            Appears normal         Bladder:                Appears normal
 Thoracic:              Appears normal
Doppler - Fetal Vessels

 Umbilical Artery
   S/D    %tile      RI    %tile      PI    %tile            ADFV    RDFV
 10.95   > 97.5    0.91   > 97.5    1.77   > [AGE]       N

Cervix Uterus Adnexa

 Cervix
 Length:              3  cm.
 Normal appearance by transabdominal scan.

 Uterus
 Multiple fibroids noted, see table below.

 Right Ovary
 No adnexal mass visualized.

 Left Ovary
 No adnexal mass visualized.

 Cul De Sac
 No free fluid seen.

 Adnexa
 No abnormality visualized.
Myomas

 Site                     L(cm)      W(cm)       D(cm)      Location
 Sag anterior
 Sag anterior 2
 Blood Flow                  RI       PI       Comments

Impression

 Antenatal testing due to IUGR with an EFW 15th% with an
 AC < 7th%.
 Biophysical profile [DATE] with good fetal movement and
 amniotic fluid volume
 UA Dopplers demonstrated intermittent absent with majority
 of Dopplers measurments demonstrating absened EDF but
 no evidence of REDF
Recommendations

 Continue daily NST
 2x weekly UA dopplers.

## 2023-01-24 IMAGING — US US MFM UA CORD DOPPLER
1 series · 12 of 28 positions shown · non-contrast
Comparison: none

[Series 1: us mfm ua cord doppler · 166 acquisitions, 12 frames shown]
[im 7/166]
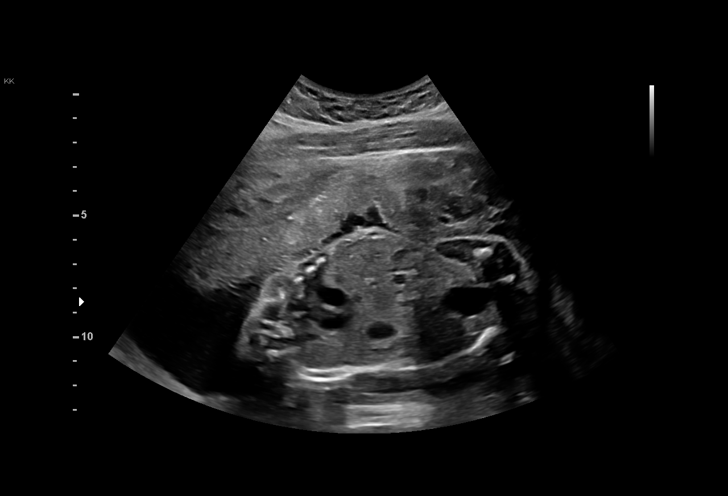
[im 19/166]
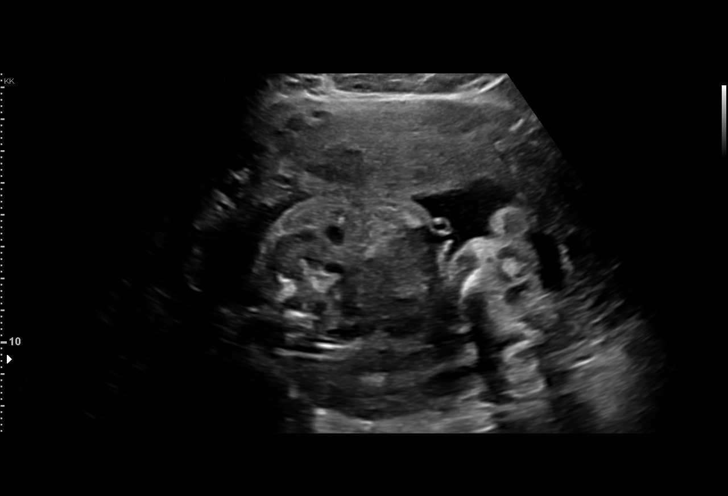
[im 31/166]
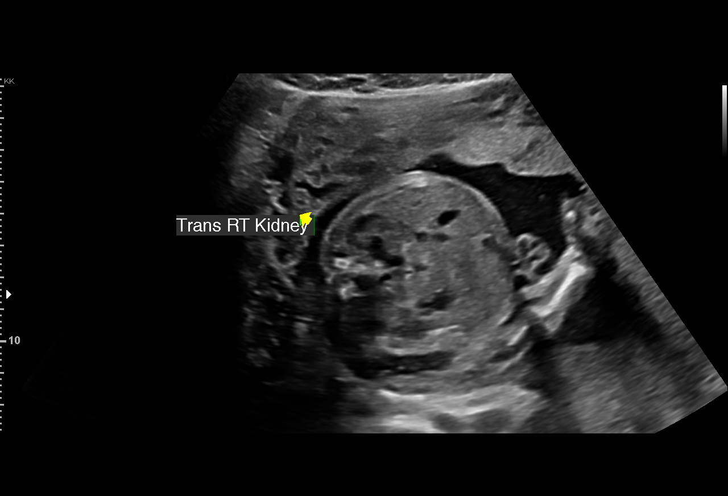
[im 49/166]
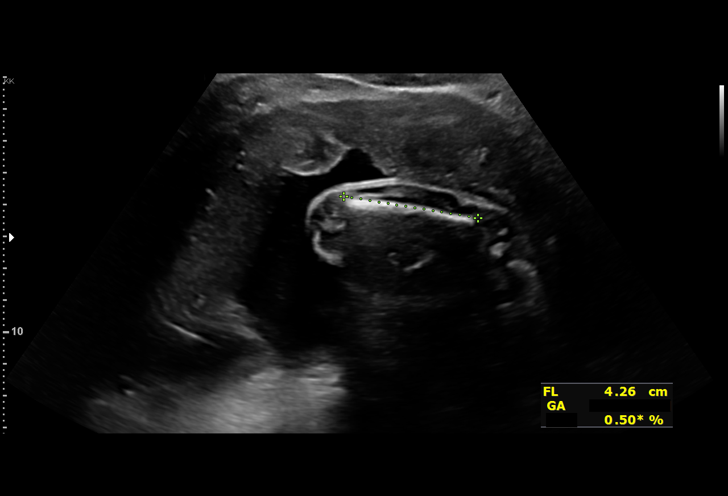
[im 62/166]
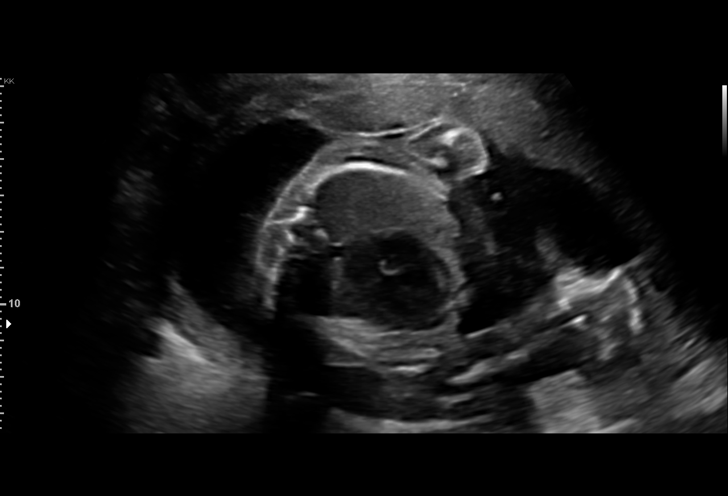
[im 74/166]
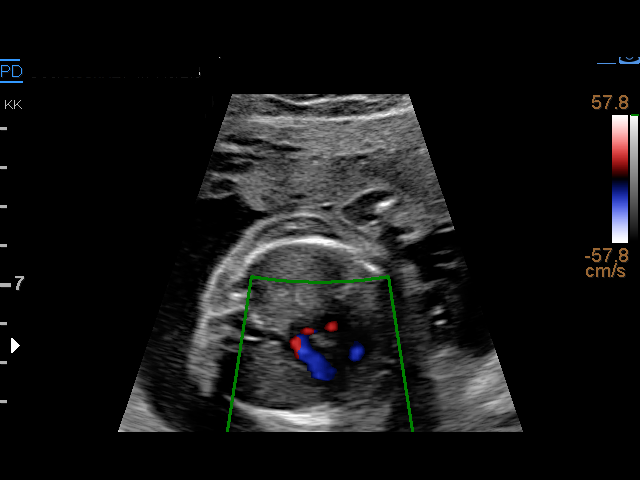
[im 92/166]
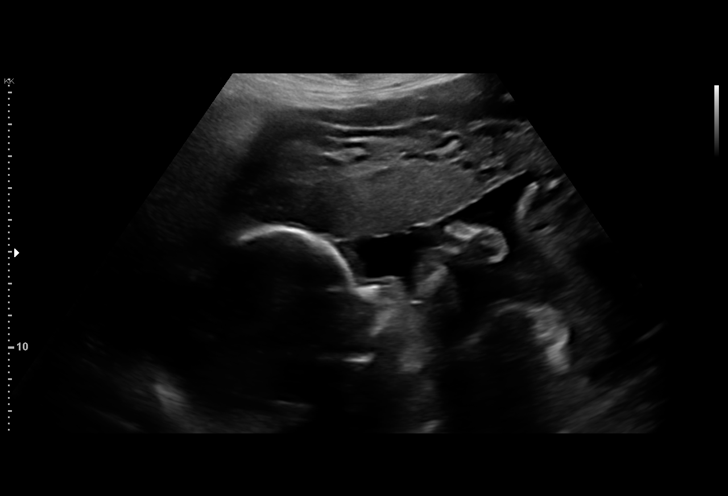
[im 104/166]
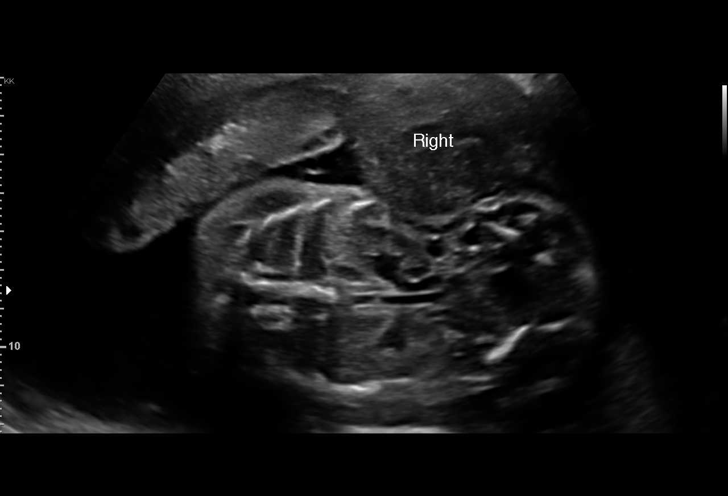
[im 117/166]
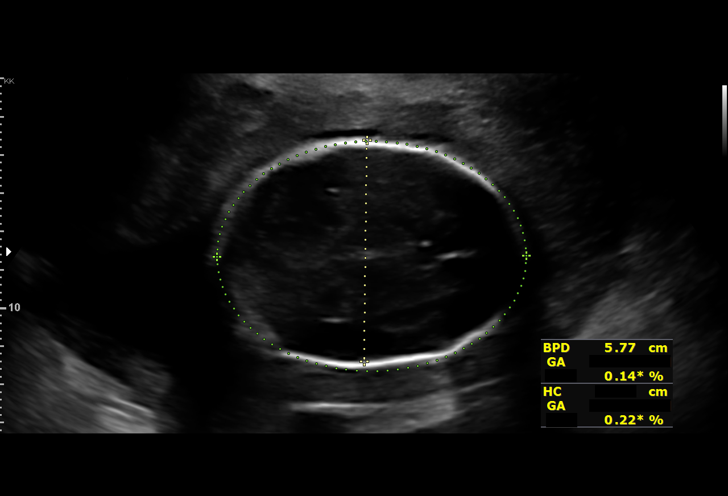
[im 135/166]
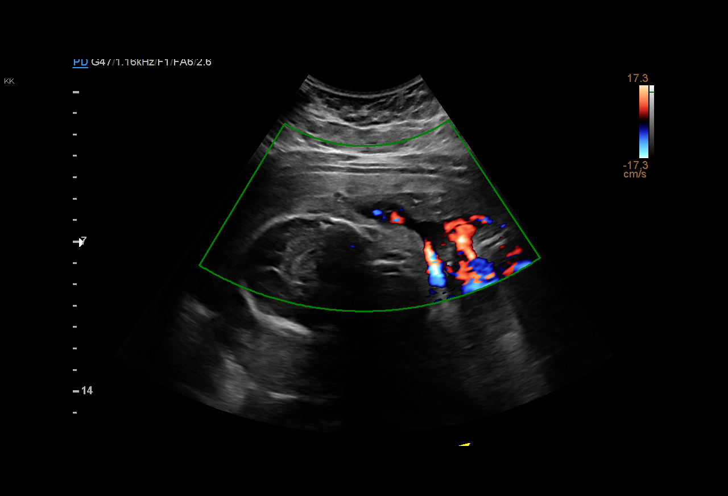
[im 147/166]
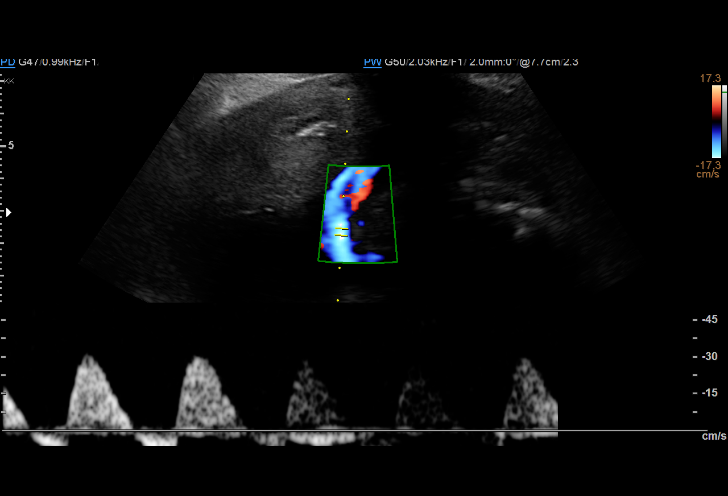
[im 159/166]
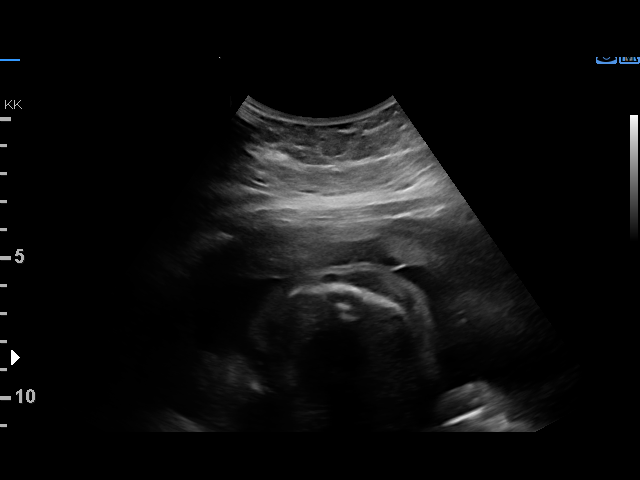

[12 of 28 positions shown; findings below may reference images not displayed]

Care                                     [HOSPITAL]

 2  US MFM UA CORD DOPPLER                76820.02    [facility]YE[facility]

Indications

 Maternal care for known or suspected poor
 fetal growth, second trimester, not applicable
 or unspecified IUGR
 Hypertension - Chronic/Pre-existing
 (Labetalol)
 Pregnancy resulting from assisted
 reproductive technology (IVF - egg donor)
 Advanced maternal age multigravida 35+,
 second trimester (45 yrs)
 Uterine fibroids affecting pregnancy in        O34.12,
 second trimester, antepartum
 Obesity complicating pregnancy, second
 trimester (BMI 37)
 26 weeks gestation of pregnancy
Fetal Evaluation

 Num Of Fetuses:         1
 Fetal Heart Rate(bpm):  141
 Cardiac Activity:       Observed
 Presentation:           Breech
 Placenta:               Anterior
 P. Cord Insertion:      Previously Visualized

 Amniotic Fluid
 AFI FV:      Within normal limits

                             Largest Pocket(cm)

Biometry

 BPD:      57.8  mm     G. Age:  23w 5d        < 1  %    CI:           68   %    70 - 86
                                                         FL/HC:      20.0   %    18.6 -
 HC:      224.2  mm     G. Age:  24w 3d        < 1  %    HC/AC:      1.14        1.05 -
 AC:       197   mm     G. Age:  24w 3d        2.1  %    FL/BPD:     77.7   %    71 - 87
 FL:       44.9  mm     G. Age:  24w 6d        3.4  %    FL/AC:      22.8   %    20 - 24
 HUM:      39.6  mm     G. Age:  24w 1d        < 5  %

 Est. FW:     704  gm      1 lb 9 oz    1.1  %
OB History

 Gravidity:    3         Term:   0        Prem:   0        SAB:   2
 TOP:          0       Ectopic:  0        Living: 0
Gestational Age

 U/S Today:     24w 3d                                        EDD:   01/04/22
 Best:          26w 4d     Det. By:  Embryo Transfer          EDD:   12/20/21
Anatomy

 Cranium:               Appears normal         Aortic Arch:            Not well visualized
 Cavum:                 Appears normal         Ductal Arch:            Not well visualized
 Ventricles:            Appears normal         Diaphragm:              Appears normal
 Choroid Plexus:        Appears normal         Stomach:                Appears normal, left
                                                                       sided
 Cerebellum:            Not well visualized    Abdomen:                Appears normal
 Posterior Fossa:       Not well visualized    Abdominal Wall:         Appears nml (cord
                                                                       insert, abd wall)
 Nuchal Fold:           Not applicable (>20    Cord Vessels:           Appears normal (3
                        wks GA)                                        vessel cord)
 Face:                  Appears normal         Kidneys:                Appear normal
                        (orbits and profile)
 Lips:                  Appears normal         Bladder:                Appears normal
 Thoracic:              Appears normal         Spine:                  Limited views
                                                                       appear normal
 Heart:                 Appears normal         Upper Extremities:      Appears normal
                        (4CH, axis, and
                        situs)
 RVOT:                  Not well visualized    Lower Extremities:      Appears normal
 LVOT:                  Appears normal

 Other:  Technically difficult due to maternal habitus and fetal position. VC and
         3VV visualized.
Doppler - Fetal Vessels

 Umbilical Artery
                                                             ADFV    RDFV
                                                               Yes     Yes

Cervix Uterus Adnexa

 Cervix
 Not visualized (advanced GA >04wks)
 Adnexa
 No abnormality visualized.
Comments

 Yu Ping Hatie has been hospitalized due to exacerbation of
 chronic hypertension and IUGR.  Intermittent reversed end-
 diastolic flow was noted on her umbilical artery Doppler
 studies yesterday.  Her
 AST and ALT levels were also mildly elevated yesterday.

 On today's ultrasound exam, the overall EFW was 1 pound 9
 ounces (704 g, 1st percentile for her gestational age).  The
 fetus grew only 7 ounces over the past 3 weeks.

 There was normal amniotic fluid noted.

 Fetal body movements and fetal breathing movements were
 noted throughout today's exam.

 Doppler studies of the umbilical arteries performed today
 continues to show intermittent reversed end-diastolic flow
 (unchanged from yesterday).

 The patient's NST shows 10 x 10 accelerations with an
 occasional mild variable deceleration.

 Her AST and ALT levels were 50 and 55 today.  This is
 slightly increased as compared to her labs yesterday.  Her
 platelet count remains within normal limits.  Her serum
 creatinine level remains within normal limits.

 As her umbilical artery Doppler studies continues to show
 intermittent reversed end-diastolic flow and due to her mildly
 elevated liver function tests, a rescue course (2 doses of
 Lebaivalu 24 hours apart) of antenatal corticosteroids
 should be given at this time.

 Her PIH labs should be repeated again tomorrow.  Delivery is
 recommended once she completes her rescue steroid course
 should her liver function tests continue to increase.

 Magnesium sulfate should be started for fetal neuroprotection
 and be given for at least 3 to 4 hours prior to delivery.  As she
 is not being delivered at this time, we can hold off on giving
 her magnesium until the decision has been made for delivery.

 At her current gestational age, delaying delivery for even a
 few extra days may help improve the neonatal outcome.

 She should continue NSTs 3 times a day.  We will repeat the
 umbilical artery Dopplers again in 2 days.

## 2023-04-15 DIAGNOSIS — R7309 Other abnormal glucose: Secondary | ICD-10-CM | POA: Insufficient documentation

## 2023-09-17 ENCOUNTER — Ambulatory Visit
Admission: RE | Admit: 2023-09-17 | Discharge: 2023-09-17 | Disposition: A | Discharge status: Home / Self Care | Payer: BC Managed Care – PPO | Source: Ambulatory Visit | Attending: Internal Medicine | Admitting: Internal Medicine

## 2023-09-17 VITALS — BP 168/94 | HR 98 | Temp 98.1°F | Resp 18

## 2023-09-17 DIAGNOSIS — Z20828 Contact with and (suspected) exposure to other viral communicable diseases: Secondary | ICD-10-CM | POA: Diagnosis not present

## 2023-09-17 DIAGNOSIS — R051 Acute cough: Secondary | ICD-10-CM | POA: Diagnosis present

## 2023-09-17 LAB — POCT INFLUENZA A/B
Influenza A, POC: NEGATIVE
Influenza B, POC: NEGATIVE

## 2023-09-17 MED ORDER — BENZONATATE 200 MG PO CAPS: 200.0000 mg | ORAL_CAPSULE | Freq: Three times a day (TID) | ORAL | 0 refills | Status: DC | PRN

## 2023-09-17 MED ORDER — OSELTAMIVIR PHOSPHATE 75 MG PO CAPS: 75.0000 mg | ORAL_CAPSULE | Freq: Two times a day (BID) | ORAL | 0 refills | Status: AC

## 2023-09-17 NOTE — ED Triage Notes (Signed)
Patient presents to UC for cough, body aches, and HA x 1 day. Not taking any OTC meds.

## 2023-09-17 NOTE — ED Provider Notes (Signed)
[facility]  Note:  This document was prepared using Dragon voice recognition software and may include unintentional dictation errors.  MRN: [id number] DOB: 01-19-1976 DATE: 09/17/23   Subjective:  Chief Complaint:  Chief Complaint  Patient presents with   Cough    Entered by patient     HPI: Brianna Lucero is a 48 y.o. female presenting for cough and headache for less than one day. Reports multiple flu exposures at home. She also reports sore throat and myalgias starting today. She has not taken anything for her symptoms. Denies fever, nausea/vomiting, abdominal pain, otalgia. Endorses cough, sore throat, myalgias, headache. Presents NAD.  Prior to Admission medications   Medication Sig Start Date End Date Taking? Authorizing Provider  losartan (COZAAR) 25 MG tablet Take 1 tablet by mouth daily. 09/12/22  Yes [provider]  sertraline (ZOLOFT) 25 MG tablet Take by mouth. 12/20/21  Yes [provider]  tirzepatide (ZEPBOUND) 2.5 MG/0.5ML Pen Inject into the skin. 09/12/22  Yes [provider]  acetaminophen (TYLENOL) 325 MG tablet Take 2 tablets (650 mg total) by mouth every 4 (four) hours as needed for mild pain (temperature > 101.5.). 09/23/21   June Leap, CNM  acyclovir (ZOVIRAX) 400 MG tablet Take 1 tablet (400 mg total) by mouth 2 (two) times daily. 09/23/21   June Leap, CNM  amLODipine (NORVASC) 10 MG tablet Take 10 mg by mouth daily.    [provider]  cholecalciferol (VITAMIN D3) 25 MCG (1000 UNIT) tablet Take 1,000 Units by mouth daily.    [provider]  labetalol (NORMODYNE) 200 MG tablet Take 1 tablet (200 mg total) by mouth 3 (three) times daily. 09/23/21 10/23/21  June Leap, CNM  loratadine (CLARITIN) 10 MG tablet Take 10 mg by mouth daily.    [provider]  Prenatal Vit-Fe Fumarate-FA (PRENATAL MULTIVITAMIN) TABS tablet Take 1 tablet by mouth daily at 12 noon.    [provider]      Allergies  Allergen Reactions   Lisinopril Cough    Other Reaction(s): Cough    History:   Past Medical History:  Diagnosis Date   Hypertension      Past Surgical History:  Procedure Laterality Date   CESAREAN SECTION N/A 09/20/2021   Procedure: CESAREAN SECTION;  Surgeon: Shea Evans, MD;  Location: [facility];  Service: Obstetrics;  Laterality: N/A;   DILATION AND CURETTAGE OF UTERUS     KNEE ARTHROSCOPY      History reviewed. No pertinent family history.  Social History   Tobacco Use   Smoking status: Never   Smokeless tobacco: Never  Vaping Use   Vaping status: Never Used  Substance Use Topics   Alcohol use: Yes   Drug use: Never    Review of Systems  Constitutional:  Positive for chills and fatigue. Negative for fever.  HENT:  Positive for congestion, rhinorrhea and sore throat. Negative for ear pain.   Respiratory:  Positive for cough.   Gastrointestinal:  Negative for abdominal pain, nausea and vomiting.  Musculoskeletal:  Positive for myalgias.  Neurological:  Positive for headaches.     Objective:   Vitals: BP (!) 168/94 (BP Location: Right Arm)   Pulse 98   Temp 98.1 F (36.7 C) (Oral)   Resp 18   LMP 08/17/2023   SpO2 97%   Breastfeeding No   Physical Exam Constitutional:      General: She is not in acute distress.    Appearance: Normal  appearance. She is well-developed and normal weight. She is not ill-appearing or toxic-appearing.  HENT:     Head: Normocephalic and atraumatic.     Right Ear: Tympanic membrane and ear canal normal.     Left Ear: Tympanic membrane and ear canal normal.     Mouth/Throat:     Pharynx: Oropharynx is clear. Uvula midline. No pharyngeal swelling, oropharyngeal exudate or posterior oropharyngeal erythema.     Tonsils: No tonsillar exudate or tonsillar abscesses.  Cardiovascular:     Rate and Rhythm: Normal rate and regular rhythm.     Heart sounds: Normal heart sounds.  Pulmonary:     Effort: Pulmonary  effort is normal.     Breath sounds: Normal breath sounds.     Comments: Clear to auscultation bilaterally  Abdominal:     General: Bowel sounds are normal.     Palpations: Abdomen is soft.     Tenderness: There is no abdominal tenderness.  Skin:    General: Skin is warm and dry.  Neurological:     General: No focal deficit present.     Mental Status: She is alert.  Psychiatric:        Mood and Affect: Mood and affect normal.     Results:  Labs: No results found for this or any previous visit (from the past 24 hours).  Radiology: No results found.   UC Course/Treatments:  Procedures: Procedures   Medications Ordered in UC: Medications - No data to display   Assessment and Plan :     ICD-10-CM   1. Acute cough  R05.1 POCT Influenza A/B    SARS CORONAVIRUS 2 (TAT 6-24 HRS) Anterior Nasal Swab    POCT Influenza A/B    SARS CORONAVIRUS 2 (TAT 6-24 HRS) Anterior Nasal Swab    2. Exposure to influenza  Z20.828      Acute cough Afebrile, nontoxic-appearing, NAD. VSS. DDX includes but not limited to: COVID, flu, bronchitis, pneumonia, viral URI Flu was negative today in office. COVID is pending. Given multiple household members with the flu, Tamiflu 75mg  BID was prescribed. Benzonatate 200mg  TID PRN was prescribed for cough. Strict ED precautions were given and patient verbalized understanding.  ED Discharge Orders          Ordered    oseltamivir (TAMIFLU) 75 MG capsule  Every 12 hours        09/17/23 1455    benzonatate (TESSALON) 200 MG capsule  3 times daily PRN        09/17/23 1455             I have reviewed the PDMP during this encounter.     Cynda Acres, PA-C 09/17/23 1456

## 2023-09-17 NOTE — Discharge Instructions (Addendum)
Your flu test was negative. COVID was sent to the lab for further testing. Someone from our office will call you with your results.Your symptoms appear consistent with a viral respiratory illness. Given your recent exposure to the flu and current symptoms, I did send a prescription for Tamiflu to your pharmacy. Please take as directed. Recommend symptomatic treatment with Tylenol/Ibuprofen as needed for fevers/aches. I also sent a prescription for a cough medicine as well.   Return in 3-4 days if no improvement. If new or worsening symptoms such as presistent fevers, difficulty breathing, shortness of breath, or chest pain, go directly to the ER.

## 2023-09-18 LAB — SARS CORONAVIRUS 2 (TAT 6-24 HRS): SARS Coronavirus 2: NEGATIVE

## 2023-11-07 ENCOUNTER — Other Ambulatory Visit: Payer: Self-pay

## 2023-11-07 ENCOUNTER — Ambulatory Visit
Admission: RE | Admit: 2023-11-07 | Discharge: 2023-11-07 | Disposition: A | Discharge status: Home / Self Care | Source: Ambulatory Visit | Attending: Physician Assistant

## 2023-11-07 VITALS — BP 126/80 | HR 80 | Temp 98.1°F | Resp 16

## 2023-11-07 DIAGNOSIS — J309 Allergic rhinitis, unspecified: Secondary | ICD-10-CM | POA: Diagnosis not present

## 2023-11-07 MED ORDER — RYALTRIS 665-25 MCG/ACT NA SUSP: 2.0000 | Freq: Two times a day (BID) | NASAL | 1 refills | Status: DC

## 2023-11-07 MED ORDER — LORATADINE 10 MG PO TABS: 10.0000 mg | ORAL_TABLET | Freq: Every day | ORAL | 2 refills | Status: DC

## 2023-11-07 NOTE — ED Triage Notes (Signed)
 C/O sinus symptoms "scratchy feeling in roof of mouth" Sneezing and nasal congestion. Symptoms x 5 weeks. Denies fever or chills. Denies sore throat. 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

## 2023-11-07 NOTE — ED Provider Notes (Signed)
 [facility]    CSN: [id number] Arrival date & time: 11/07/23  1033      History   Chief Complaint Chief Complaint  Patient presents with   Allergic Reaction    Entered by patient    HPI Brianna Lucero is a 48 y.o. female.   HPI  She reports she is having persistent sneezing, itching sensation along the roof of her mouth and nose She also reports nasal congestion  Interventions: Alkaseltzer cold and flu She reports this helps with the congestion on most days  She reports her whole household was sick at the beginning of Feb but she is the only one with lingering symptoms   She takes Cetirizine  daily and has been on this for years  She has tried saline nasal spray and tried using Flonase for a few days but stopped due to lack of improvement    Past Medical History:  Diagnosis Date   Hypertension     Patient Active Problem List   Diagnosis Date Noted   Blood glucose abnormal 04/15/2023   Leucopenia 03/14/2022   Major depressive disorder with single episode, in full remission (HCC) 03/14/2022   Seasonal allergies 03/14/2022   Class 2 severe obesity due to excess calories with serious comorbidity and body mass index (BMI) of 36.0 to 36.9 in adult [facility]) 12/20/2021   Severe preeclampsia 09/20/2021   Status post primary low transverse cesarean section 2/3 09/20/2021   Postpartum care following cesarean delivery 2/3 09/20/2021   Chronic hypertension 09/20/2021   Hypertension in pregnancy, preeclampsia, severe, delivered/postpartum 09/20/2021   IUGR (intrauterine growth restriction) affecting care of mother 08/29/2021   Anxiety 02/22/2013   Chest pain 02/22/2013   Depressive disorder 02/22/2013   Gastroesophageal reflux disease 02/22/2013   Goiter 02/22/2013   Hearing loss 02/22/2013   Palpitations 02/22/2013   Vitamin D deficiency 02/22/2013   Panic disorder without agoraphobia 02/22/2013    Past Surgical History:  Procedure Laterality Date   CESAREAN  SECTION N/A 09/20/2021   Procedure: CESAREAN SECTION;  Surgeon: Shea Evans, MD;  Location: [facility];  Service: Obstetrics;  Laterality: N/A;   DILATION AND CURETTAGE OF UTERUS     KNEE ARTHROSCOPY      OB History     Gravida  3   Para  1   Term      Preterm  1   AB  2   Living  1      SAB  2   IAB      Ectopic      Multiple  0   Live Births  1            Home Medications    Prior to Admission medications   Medication Sig Start Date End Date Taking? Authorizing Provider  loratadine (CLARITIN) 10 MG tablet Take 1 tablet (10 mg total) by mouth daily. 11/07/23  Yes Praise Dolecki E, PA-C  Olopatadine-Mometasone (RYALTRIS) [id number] MCG/ACT SUSP Place 2 puffs into the nose in the morning and at bedtime. 11/07/23  Yes Francessca Friis E, PA-C  acetaminophen (TYLENOL) 325 MG tablet Take 2 tablets (650 mg total) by mouth every 4 (four) hours as needed for mild pain (temperature > 101.5.). 09/23/21   June Leap, CNM  acyclovir (ZOVIRAX) 400 MG tablet Take 1 tablet (400 mg total) by mouth 2 (two) times daily. 09/23/21   June Leap, CNM  amLODipine (NORVASC) 10 MG tablet Take 10 mg by mouth daily.  [provider]  benzonatate (TESSALON) 200 MG capsule Take 1 capsule (200 mg total) by mouth 3 (three) times daily as needed for cough. 09/17/23   Hermanns, Ashlee P, PA-C  cholecalciferol (VITAMIN D3) 25 MCG (1000 UNIT) tablet Take 1,000 Units by mouth daily.    [provider]  labetalol (NORMODYNE) 200 MG tablet Take 1 tablet (200 mg total) by mouth 3 (three) times daily. 09/23/21 10/23/21  June Leap, CNM  loratadine (CLARITIN) 10 MG tablet Take 10 mg by mouth daily.    [provider]  losartan (COZAAR) 25 MG tablet Take 1 tablet by mouth daily. 09/12/22   [provider]  Prenatal Vit-Fe Fumarate-FA (PRENATAL MULTIVITAMIN) TABS tablet Take 1 tablet by mouth daily at 12 noon.    [provider]  sertraline (ZOLOFT) 25 MG tablet Take  by mouth. 12/20/21   [provider]  tirzepatide (ZEPBOUND) 2.5 MG/0.5ML Pen Inject into the skin. 09/12/22   [provider]    Family History No family history on file.  Social History Social History   Tobacco Use   Smoking status: Never   Smokeless tobacco: Never  Vaping Use   Vaping status: Never Used  Substance Use Topics   Alcohol use: Yes   Drug use: Never     Allergies   Lisinopril   Review of Systems Review of Systems  Constitutional:  Positive for chills (she reports she is cold a lot). Negative for fatigue and fever.  HENT:  Positive for congestion, rhinorrhea and sneezing. Negative for ear pain, postnasal drip, sinus pressure, sinus pain, sore throat and trouble swallowing.   Eyes:  Positive for discharge (watery eyes) and itching.  Respiratory:  Negative for cough, choking, shortness of breath and wheezing.   Musculoskeletal:  Negative for myalgias.  Neurological:  Negative for dizziness and headaches.     Physical Exam Triage Vital Signs ED Triage Vitals  Encounter Vitals Group     BP 11/07/23 1052 126/80     Systolic BP Percentile --      Diastolic BP Percentile --      Pulse Rate 11/07/23 1052 80     Resp 11/07/23 1052 16     Temp 11/07/23 1052 98.1 F (36.7 C)     Temp Source 11/07/23 1052 Oral     SpO2 11/07/23 1052 98 %     Weight --      Height --      Head Circumference --      Peak Flow --      Pain Score 11/07/23 1056 0     Pain Loc --      Pain Education --      Exclude from Growth Chart --    No data found.  Updated Vital Signs BP 126/80 (BP Location: Right Arm)   Pulse 80   Temp 98.1 F (36.7 C) (Oral)   Resp 16   LMP 10/11/2023   SpO2 98%   Visual Acuity Right Eye Distance:   Left Eye Distance:   Bilateral Distance:    Right Eye Near:   Left Eye Near:    Bilateral Near:     Physical Exam Vitals reviewed.  Constitutional:      General: She is awake.     Appearance: Normal appearance. She is  well-developed and well-groomed.  HENT:     Head: Normocephalic and atraumatic.     Right Ear: Hearing, tympanic membrane and ear canal normal.  Left Ear: Hearing, tympanic membrane and ear canal normal.     Nose: No nasal deformity, septal deviation, nasal tenderness or mucosal edema.     Right Turbinates: Enlarged and swollen.     Left Turbinates: Enlarged and swollen.     Mouth/Throat:     Lips: Pink.     Mouth: Mucous membranes are moist.     Pharynx: Oropharynx is clear. Uvula midline. No pharyngeal swelling, oropharyngeal exudate, posterior oropharyngeal erythema, uvula swelling or postnasal drip.     Tonsils: No tonsillar exudate or tonsillar abscesses.  Eyes:     General: Lids are normal. Gaze aligned appropriately.  Cardiovascular:     Rate and Rhythm: Normal rate and regular rhythm.     Heart sounds: Normal heart sounds. No murmur heard.    No friction rub. No gallop.  Pulmonary:     Effort: Pulmonary effort is normal.     Breath sounds: Normal breath sounds. No decreased air movement. No decreased breath sounds, wheezing, rhonchi or rales.  Musculoskeletal:     Cervical back: Normal range of motion and neck supple.  Lymphadenopathy:     Head:     Right side of head: No submental, submandibular or preauricular adenopathy.     Left side of head: No submental, submandibular or preauricular adenopathy.     Cervical:     Right cervical: No superficial cervical adenopathy.    Left cervical: No superficial cervical adenopathy.     Upper Body:     Right upper body: No supraclavicular adenopathy.     Left upper body: No supraclavicular adenopathy.  Skin:    General: Skin is warm and dry.  Neurological:     Mental Status: She is alert.  Psychiatric:        Behavior: Behavior is cooperative.      UC Treatments / Results  Labs (all labs ordered are listed, but only abnormal results are displayed) Labs Reviewed - No data to display  EKG   Radiology No results  found.  Procedures Procedures (including critical care time)  Medications Ordered in UC Medications - No data to display  Initial Impression / Assessment and Plan / UC Course  I have reviewed the triage vital signs and the nursing notes.  Pertinent labs & imaging results that were available during my care of the patient were reviewed by me and considered in my medical decision making (see chart for details).      Final Clinical Impressions(s) / UC Diagnoses   Final diagnoses:  Allergic rhinitis, unspecified seasonality, unspecified trigger   Patient presents today with concerns for persistent nasal congestion, itchy nose, sneezing for the past 4 to 5 weeks.  She reports that she was previously sick around the beginning of February but her symptoms have not improved despite taking Alka-Seltzer cold and flu.  She does take cetirizine daily but reports that she has been on this for some time and has not noticed any benefit for a while.  Symptoms, physical exam appear most consistent with allergic rhinitis and seasonal allergies.  At this time I recommend switching to a different antihistamine such as Claritin or Allegra.  Claritin was sent in per patient request pharmacy on file.  Will also send in Ryaltris if she wishes to try this instead of Flonase.  Reviewed importance of environmental maintenance and cleaning to help prevent further triggers.  Reviewed signs and symptoms of potential bacterial sinus infection but patient does not appear to be demonstrating these at  this time.  Recommend follow-up with primary care provider if symptoms are not improving or seem to be worsening over the next 4 to 6 weeks.    Discharge Instructions      At this time I suspect your symptoms are likely secondary to allergies.  This can be managed with OTC medication such as antihistamines and intranasal steroids like Flonase  Since you have been taking Zyrtec for several months I recommend switching this  to a different antihistamine.  I have sent in Claritin for you to take once per day as directed to assist with your symptoms.  If you feel like your symptoms have started to resolve you can switch back to Zyrtec daily for maintenance. You can try continuing to use the Flonase twice per day as needed to help with the nasal congestion and itchiness.  I have also sent in a medication called Ryaltris for you to try.  This is another nasal spray that you can use instead of the Flonase per your preference. If desired you can use a nasal saline flush prior to using the intranasal steroid to help flush out any debris or potential allergens like pollen or dander. I recommend trying to avoid potential triggers and make sure that you are cleaning home linens such as bedding towels, carpets routinely.  Make sure that you are wiping down surfaces to prevent further irritation.  If your symptoms do not appear to be improving over the next 4 to 6 weeks you can follow-up with your primary care provider for ongoing management.      ED Prescriptions     Medication Sig Dispense Auth. Provider   Olopatadine-Mometasone (RYALTRIS) [phone] MCG/ACT SUSP Place 2 puffs into the nose in the morning and at bedtime. 29 g Merek Niu E, PA-C   loratadine (CLARITIN) 10 MG tablet Take 1 tablet (10 mg total) by mouth daily. 30 tablet Jamon Hayhurst E, PA-C      PDMP not reviewed this encounter.   Providence Crosby, PA-C 11/07/23 1123

## 2023-11-07 NOTE — Discharge Instructions (Addendum)
 At this time I suspect your symptoms are likely secondary to allergies.  This can be managed with OTC medication such as antihistamines and intranasal steroids like Flonase  Since you have been taking Zyrtec for several months I recommend switching this to a different antihistamine.  I have sent in Claritin for you to take once per day as directed to assist with your symptoms.  If you feel like your symptoms have started to resolve you can switch back to Zyrtec daily for maintenance. You can try continuing to use the Flonase twice per day as needed to help with the nasal congestion and itchiness.  I have also sent in a medication called Ryaltris for you to try.  This is another nasal spray that you can use instead of the Flonase per your preference. If desired you can use a nasal saline flush prior to using the intranasal steroid to help flush out any debris or potential allergens like pollen or dander. I recommend trying to avoid potential triggers and make sure that you are cleaning home linens such as bedding towels, carpets routinely.  Make sure that you are wiping down surfaces to prevent further irritation.  If your symptoms do not appear to be improving over the next 4 to 6 weeks you can follow-up with your primary care provider for ongoing management.

## 2024-01-25 DIAGNOSIS — R7303 Prediabetes: Secondary | ICD-10-CM | POA: Insufficient documentation

## 2024-08-12 DIAGNOSIS — E782 Mixed hyperlipidemia: Secondary | ICD-10-CM | POA: Insufficient documentation

## 2024-09-22 ENCOUNTER — Ambulatory Visit
Admission: RE | Admit: 2024-09-22 | Discharge: 2024-09-22 | Disposition: A | Discharge status: Home / Self Care | Source: Ambulatory Visit

## 2024-09-22 VITALS — BP 158/95 | HR 73 | Temp 97.7°F | Resp 18

## 2024-09-22 DIAGNOSIS — I10 Essential (primary) hypertension: Secondary | ICD-10-CM

## 2024-09-22 DIAGNOSIS — R0789 Other chest pain: Secondary | ICD-10-CM

## 2024-09-22 DIAGNOSIS — R42 Dizziness and giddiness: Secondary | ICD-10-CM | POA: Diagnosis not present

## 2024-09-22 DIAGNOSIS — R634 Abnormal weight loss: Secondary | ICD-10-CM

## 2024-09-22 DIAGNOSIS — T50905A Adverse effect of unspecified drugs, medicaments and biological substances, initial encounter: Secondary | ICD-10-CM | POA: Diagnosis not present

## 2024-09-22 DIAGNOSIS — R8290 Unspecified abnormal findings in urine: Secondary | ICD-10-CM

## 2024-09-22 DIAGNOSIS — R82998 Other abnormal findings in urine: Secondary | ICD-10-CM

## 2024-09-22 DIAGNOSIS — E639 Nutritional deficiency, unspecified: Secondary | ICD-10-CM | POA: Diagnosis not present

## 2024-09-22 LAB — POCT URINE DIPSTICK
Bilirubin, UA: NEGATIVE
Blood, UA: NEGATIVE
Glucose, UA: NEGATIVE mg/dL
Ketones, POC UA: NEGATIVE mg/dL
Nitrite, UA: NEGATIVE
Protein Ur, POC: NEGATIVE mg/dL
Spec Grav, UA: 1.015
Urobilinogen, UA: 0.2 U/dL
pH, UA: 8.5 — AB

## 2024-09-22 LAB — GLUCOSE, POCT (MANUAL RESULT ENTRY): POCT Glucose (KUC): 107 mg/dL — AB (ref 70–99)

## 2024-09-22 LAB — NA AND K (SODIUM & POTASSIUM), RAND UR
Potassium Urine: 28 mmol/L
Sodium, Ur: 194 mmol/L

## 2024-09-22 NOTE — Discharge Instructions (Signed)
 Your symptoms of headache, chest tightness, fatigue, shortness of breath can all be related to inadequate diet secondary to taking a medication that suppresses your appetite.  Your EKG today was reassuringly normal and your breath sounds are clear.  Your blood sugar was slightly elevated but you were not fasting when we checked it so I believe that this can be deemed normal.    Your urine has significantly elevated pH level and, despite its very light yellow color, it was cloudy.  This is concerning for your urine having high concentrations of sodium and potassium in it which is related to renal tubular acidosis.    Renal tubular acidosis can be caused by having an inadequate dietary intake, loss of muscle mass related to inadequate dietary intake and then physically exerting yourself.  We will send your urine for sodium and potassium testing.  We have also drawn blood to check your metabolic panel.  Metabolic panel will check the sodium and potassium levels in your blood and it will also evaluate your kidney function.  Once we receive the results of these test, we will contact you by phone to let you know if any further action is needed.  In the meantime, please try to make sure that you are consuming at least 600 cal/day and ensure that much of those calories are coming from protein rich sources which often contain other essential vitamins and nutrients.  Thank you for visiting [facility] today.  We appreciate the opportunity to participate in your care.

## 2024-09-22 NOTE — ED Provider Notes (Signed)
 [facility]    CSN: [id number] Arrival date & time: 09/22/24  1727    HISTORY   Chief Complaint  Patient presents with   Cough    Chest tightness - Entered by patient   Headache   HPI Brianna Lucero is a pleasant, 49 y.o. female who presents to urgent care today. Patient states that for the past week she has had a cough and nasal congestion.  States that 4 days ago she went outside and shoveled snow from her front sidewalk.  Patient states she had to come inside to rest once but was able to go outside and complete the job.  States that since this occurred,she has had fatigue, chest tightness, and has felt like someone was sitting on her chest.  Denies chest pain or tightness at this time.  Patient complains that she began to have a headache last night.  Patient reports her daughter having RSV 10 days ago and that her father also had pneumonia recently.  States she has tried Aleve D and Hydrographic Surveyor and Flu with some relief.  Patient states she is currently taking Zepbound, reports inadequate dietary intake secondary to taking this medication.  Denies burning with urination, increased frequency of urination, flank pain.  States she has been drinking a lot of water  since she started taking Zepbound.  EMR reviewed by me.  Patient was seen by her provider on December 26, her provider documented that she had lost a total of 13 pounds since she began Zepbound which, according to EMR, was over 1 year ago.  Patient states today she has lost a total of 25 pounds which would mean that she has lost 12 pounds over the past 5 weeks.    The history is provided by the patient.  Cough  Past Medical History:  Diagnosis Date   Hypertension    Patient Active Problem List   Diagnosis Date Noted   Mixed hyperlipidemia 08/12/2024   Prediabetes 01/25/2024   Blood glucose abnormal 04/15/2023   Leucopenia 03/14/2022   Major depressive disorder with single episode, in full remission 03/14/2022    Seasonal allergies 03/14/2022   Class 2 severe obesity due to excess calories with serious comorbidity and body mass index (BMI) of 36.0 to 36.9 in adult 12/20/2021   Severe preeclampsia 09/20/2021   Status post primary low transverse cesarean section 2/3 09/20/2021   Postpartum care following cesarean delivery 2/3 09/20/2021   Chronic hypertension 09/20/2021   Hypertension in pregnancy, preeclampsia, severe, delivered/postpartum 09/20/2021   IUGR (intrauterine growth restriction) affecting care of mother 08/29/2021   Anxiety 02/22/2013   Chest pain 02/22/2013   Depressive disorder 02/22/2013   Gastroesophageal reflux disease 02/22/2013   Goiter 02/22/2013   Hearing loss 02/22/2013   Palpitations 02/22/2013   Vitamin D deficiency 02/22/2013   Panic disorder without agoraphobia 02/22/2013   Acute bronchitis 02/22/2013   Dizziness 02/22/2013   Neck pain 02/22/2013   Precordial pain 02/22/2013   Past Surgical History:  Procedure Laterality Date   CESAREAN SECTION N/A 09/20/2021   Procedure: CESAREAN SECTION;  Surgeon: Barbette Knock, MD;  Location: [facility];  Service: Obstetrics;  Laterality: N/A;   DILATION AND CURETTAGE OF UTERUS     KNEE ARTHROSCOPY     OB History     Gravida  4   Para  1   Term      Preterm  1   AB  2   Living  1  SAB  2   IAB      Ectopic      Multiple  0   Live Births  1          Home Medications    Prior to Admission medications  Medication Sig Start Date End Date Taking? Authorizing Provider  ALPRAZolam (XANAX) 0.25 MG tablet TAKE 1 TABLET(S) EVERY DAY BY ORAL ROUTE AS NEEDED. 07/25/15  Yes [provider]  amLODipine (NORVASC) 10 MG tablet Take 10 mg by mouth daily.   Yes [provider]  cetirizine (ZYRTEC) 10 MG tablet Take 1 tablet by mouth daily. Patient taking differently: Take 1 tablet by mouth daily as needed. 03/14/22  Yes [provider]  cholecalciferol (VITAMIN D3) 25 MCG (1000 UNIT)  tablet Take 1,000 Units by mouth daily.   Yes [provider]  labetalol  (NORMODYNE ) 200 MG tablet Take 1 tablet (200 mg total) by mouth 3 (three) times daily. 09/23/21 09/22/24 Yes Joshua Alan POUR, CNM  loratadine  (CLARITIN ) 10 MG tablet Take 10 mg by mouth daily.   Yes [provider]  sertraline (ZOLOFT) 25 MG tablet Take by mouth. 12/20/21  Yes [provider]  tirzepatide (ZEPBOUND) 2.5 MG/0.5ML Pen Inject into the skin. 09/12/22  Yes [provider]  VITAMIN D PO Take by mouth.   Yes [provider]  acetaminophen  (TYLENOL ) 325 MG tablet Take 2 tablets (650 mg total) by mouth every 4 (four) hours as needed for mild pain (temperature > 101.5.). 09/23/21   Joshua Alan POUR, CNM  acyclovir  (ZOVIRAX ) 400 MG tablet Take 1 tablet (400 mg total) by mouth 2 (two) times daily. 09/23/21   Joshua Alan POUR, CNM  benzonatate  (TESSALON ) 200 MG capsule Take 1 capsule (200 mg total) by mouth 3 (three) times daily as needed for cough. Patient not taking: Reported on 09/22/2024 09/17/23   Hermanns, Ashlee P, PA-C  loratadine  (CLARITIN ) 10 MG tablet Take 1 tablet (10 mg total) by mouth daily. Patient not taking: Reported on 09/22/2024 11/07/23   Mecum, Erin E, PA-C  losartan (COZAAR) 25 MG tablet Take 1 tablet by mouth daily. Patient not taking: Reported on 09/22/2024 09/12/22   [provider]  Olopatadine-Mometasone (RYALTRIS ) 665-25 MCG/ACT SUSP Place 2 puffs into the nose in the morning and at bedtime. Patient not taking: Reported on 09/22/2024 11/07/23   Mecum, Erin E, PA-C  Prenatal Vit-Fe Fumarate-FA (PRENATAL MULTIVITAMIN) TABS tablet Take 1 tablet by mouth daily at 12 noon. Patient not taking: Reported on 09/22/2024    [provider]    Family History History reviewed. No pertinent family history. Social History Social History[1] Allergies   Lisinopril  Review of Systems Review of Systems  Respiratory:  Positive for cough.    Pertinent findings  revealed after performing a 14 point review of systems has been noted in the history of present illness.  Physical Exam Vital Signs BP (!) 158/95 (BP Location: Left Arm)   Pulse 73   Temp 97.7 F (36.5 C) (Oral)   Resp 18   LMP 06/16/2024   SpO2 97%   No data found.  Physical Exam Vitals and nursing note reviewed.  Constitutional:      General: She is awake. She is not in acute distress.    Appearance: Normal appearance. She is well-developed and well-groomed. She is not ill-appearing.  HENT:     Head: Normocephalic and atraumatic.     Salivary Glands: Right salivary gland is not diffusely enlarged or tender. Left  salivary gland is not diffusely enlarged or tender.     Right Ear: Hearing, tympanic membrane, ear canal and external ear normal.     Left Ear: Hearing, tympanic membrane, ear canal and external ear normal.     Nose: Nose normal.     Right Turbinates: Not enlarged, swollen or pale.     Left Turbinates: Not enlarged, swollen or pale.     Right Sinus: No maxillary sinus tenderness or frontal sinus tenderness.     Left Sinus: No maxillary sinus tenderness or frontal sinus tenderness.     Mouth/Throat:     Lips: Pink. No lesions.     Mouth: Mucous membranes are moist. No oral lesions.     Tongue: No lesions. Tongue does not deviate from midline.     Palate: No mass and lesions.     Pharynx: Oropharynx is clear. Uvula midline. No pharyngeal swelling, oropharyngeal exudate, posterior oropharyngeal erythema, uvula swelling or postnasal drip.     Tonsils: No tonsillar exudate. 0 on the right. 0 on the left.  Eyes:     General: Lids are normal.        Right eye: No discharge.        Left eye: No discharge.     Conjunctiva/sclera: Conjunctivae normal.     Right eye: Right conjunctiva is not injected.     Left eye: Left conjunctiva is not injected.  Neck:     Trachea: Trachea and phonation normal.  Cardiovascular:     Rate and Rhythm: Normal rate and regular rhythm.   Pulmonary:     Effort: Pulmonary effort is normal.     Breath sounds: Normal breath sounds.  Chest:     Chest wall: No tenderness.  Musculoskeletal:        General: Normal range of motion.     Cervical back: Full passive range of motion without pain, normal range of motion and neck supple. Normal range of motion.  Lymphadenopathy:     Cervical: No cervical adenopathy.  Skin:    General: Skin is warm and dry.     Findings: No erythema or rash.  Neurological:     General: No focal deficit present.     Mental Status: She is alert and oriented to person, place, and time. Mental status is at baseline.  Psychiatric:        Attention and Perception: Attention and perception normal.        Mood and Affect: Mood and affect normal.        Speech: Speech normal.        Behavior: Behavior normal. Behavior is cooperative.        Thought Content: Thought content normal.     Visual Acuity Right Eye Distance:   Left Eye Distance:   Bilateral Distance:    Right Eye Near:   Left Eye Near:    Bilateral Near:     [facility] Couse / Diagnostics / Procedures:     Radiology No results found.  Procedures ED EKG  Date/Time: 09/22/2024 6:32 PM  Performed by: Joesph Shaver Scales, PA-C Authorized by: Joesph Shaver Scales, PA-C   Interpretation:    Interpretation: normal   Rate:    ECG rate assessment: normal   Rhythm:    Rhythm: sinus rhythm    (including critical care time) EKG  Pending results:  Labs Reviewed  POCT URINE DIPSTICK - Abnormal; Notable for the following components:      Result Value   Color, UA  other (*)    Clarity, UA cloudy (*)    pH, UA 8.5 (*)    Leukocytes, UA Trace (*)    All other components within normal limits  GLUCOSE, POCT (MANUAL RESULT ENTRY) - Abnormal; Notable for the following components:   POCT Glucose ([facility]) 107 (*)    All other components within normal limits  NA AND K (SODIUM & POTASSIUM), RAND UR  COMPREHENSIVE METABOLIC PANEL WITH GFR     Medications Ordered in UC: Medications - No data to display  UC Diagnoses / Final Clinical Impressions(s)   I have reviewed the triage vital signs and the nursing notes.  Pertinent labs & imaging results that were available during my care of the patient were reviewed by me and considered in my medical decision making (see chart for details).    Final diagnoses:  Dizziness  Sensation of chest tightness  High urine pH level  Cloudy urine  Weight loss due to medication  Inadequate dietary intake  Essential hypertension   Discussed with the patient the importance of ensuring adequate dietary intake, comparing dietary requirements while using Zepbound Zepbound with post gastric bypass dietary requirements.  Patient agreed to try to eat smaller amounts of food more frequently throughout the day.  Patient advised that elevated urine pH and cloudy appearance of her very light-colored urine in the setting of fatigue, headache and shortness of breath with exertion is concerning for renal tubular acidosis.  Will check a random urine sodium and potassium level as well as a complete metabolic panel today to evaluate for possible RTA, electrolyte abnormalities and kidney dysfunction.  Will notify patient of results once received and provide further treatment recommendations as needed based on those results.  ED precautions advised.  Please see discharge instructions below for details of plan of care as provided to patient. ED Prescriptions   None    PDMP not reviewed this encounter.    Discharge Instructions      Your symptoms of headache, chest tightness, fatigue, shortness of breath can all be related to inadequate diet secondary to taking a medication that suppresses your appetite.  Your EKG today was reassuringly normal and your breath sounds are clear.  Your blood sugar was slightly elevated but you were not fasting when we checked it so I believe that this can be deemed normal.     Your urine has significantly elevated pH level and, despite its very light yellow color, it was cloudy.  This is concerning for your urine having high concentrations of sodium and potassium in it which is related to renal tubular acidosis.    Renal tubular acidosis can be caused by having an inadequate dietary intake, loss of muscle mass related to inadequate dietary intake and then physically exerting yourself.  We will send your urine for sodium and potassium testing.  We have also drawn blood to check your metabolic panel.  Metabolic panel will check the sodium and potassium levels in your blood and it will also evaluate your kidney function.  Once we receive the results of these test, we will contact you by phone to let you know if any further action is needed.  In the meantime, please try to make sure that you are consuming at least 600 cal/day and ensure that much of those calories are coming from protein rich sources which often contain other essential vitamins and nutrients.  Thank you for visiting [facility] today.  We appreciate the opportunity to participate in your  care.      Disposition Upon Discharge:  Condition: stable for discharge home  Patient presented with an acute illness with associated systemic symptoms and significant discomfort requiring urgent management. In my opinion, this is a condition that a prudent lay person (someone who possesses an average knowledge of health and medicine) may potentially expect to result in complications if not addressed urgently such as respiratory distress, impairment of bodily function or dysfunction of bodily organs.   Routine symptom specific, illness specific and/or disease specific instructions were discussed with the patient and/or caregiver at length.   As such, the patient has been evaluated and assessed, work-up was performed and treatment was provided in alignment with urgent care protocols and evidence based  medicine.  Patient/parent/caregiver has been advised that the patient may require follow up for further testing and treatment if the symptoms continue in spite of treatment, as clinically indicated and appropriate.  Patient/parent/caregiver has been advised to return to the [facility] or PCP if no better; to PCP or the Emergency Department if new signs and symptoms develop, or if the current signs or symptoms continue to change or worsen for further workup, evaluation and treatment as clinically indicated and appropriate  The patient will follow up with their current PCP if and as advised. If the patient does not currently have a PCP we will assist them in obtaining one.   The patient may need specialty follow up if the symptoms continue, in spite of conservative treatment and management, for further workup, evaluation, consultation and treatment as clinically indicated and appropriate.  Patient/parent/caregiver verbalized understanding and agreement of plan as discussed.  All questions were addressed during visit.  Please see discharge instructions below for further details of plan.  This office note has been dictated using Teaching laboratory technician.  Unfortunately, this method of dictation can sometimes lead to typographical or grammatical errors.  I apologize for your inconvenience in advance if this occurs.  Please do not hesitate to reach out to me if clarification is needed.       [1]  Social History Tobacco Use   Smoking status: Never    Passive exposure: Past   Smokeless tobacco: Never  Vaping Use   Vaping status: Never Used  Substance Use Topics   Alcohol use: Yes   Drug use: Never     Joesph Manuelita Living, PA-C 09/22/24 1921  "

## 2024-09-22 NOTE — ED Triage Notes (Addendum)
 Since last Wednesday and Thursday she has had a cough and nasal congestion. On Sunday she went outside and shoveled snow and since then she has had fatigue, chest tightness, and felt like someone was sitting on her chest. On Sunday she did have an episode of shortness of breath upon exertion. She did have a headache starting last night.   Her daughter got diagnosed with RSV 1.5 weeks ago. Her dad also had pneumonia recently.   She has tried Aleve D and Alka Seltzer Cold and Flu with some relief.

## 2024-09-23 LAB — COMPREHENSIVE METABOLIC PANEL WITH GFR
ALT: 21 [IU]/L (ref 0–32)
AST: 28 [IU]/L (ref 0–40)
Albumin: 4.6 g/dL (ref 3.9–4.9)
Alkaline Phosphatase: 79 [IU]/L (ref 41–116)
BUN/Creatinine Ratio: 11 (ref 9–23)
BUN: 10 mg/dL (ref 6–24)
Bilirubin Total: <0.2 mg/dL (ref 0.0–1.2)
CO2: 27 mmol/L (ref 20–29)
Calcium: 9.6 mg/dL (ref 8.7–10.2)
Chloride: 100 mmol/L (ref 96–106)
Creatinine, Ser: 0.88 mg/dL (ref 0.57–1.00)
Globulin, Total: 2.3 g/dL (ref 1.5–4.5)
Glucose: 82 mg/dL (ref 70–99)
Potassium: 4.1 mmol/L (ref 3.5–5.2)
Sodium: 141 mmol/L (ref 134–144)
Total Protein: 6.9 g/dL (ref 6.0–8.5)
eGFR: 82 mL/min/{1.73_m2}
